# Patient Record
Sex: Female | Born: 1935 | Race: White | Hispanic: No | Smoking: Never smoker
Health system: Southern US, Community
[De-identification: ages and names within clinical notes are randomized; demographics above are authoritative.]

## PROBLEM LIST (undated history)

## (undated) DIAGNOSIS — I10 Essential (primary) hypertension: Secondary | ICD-10-CM

## (undated) DIAGNOSIS — K219 Gastro-esophageal reflux disease without esophagitis: Secondary | ICD-10-CM

## (undated) DIAGNOSIS — C801 Malignant (primary) neoplasm, unspecified: Secondary | ICD-10-CM

## (undated) DIAGNOSIS — T7840XA Allergy, unspecified, initial encounter: Secondary | ICD-10-CM

## (undated) DIAGNOSIS — D649 Anemia, unspecified: Secondary | ICD-10-CM

## (undated) DIAGNOSIS — M1990 Unspecified osteoarthritis, unspecified site: Secondary | ICD-10-CM

## (undated) DIAGNOSIS — F0390 Unspecified dementia without behavioral disturbance: Secondary | ICD-10-CM

## (undated) HISTORY — DX: Unspecified osteoarthritis, unspecified site: M19.90

## (undated) HISTORY — DX: Allergy, unspecified, initial encounter: T78.40XA

## (undated) HISTORY — DX: Gastro-esophageal reflux disease without esophagitis: K21.9

## (undated) HISTORY — PX: NO PAST SURGERIES: SHX2092

## (undated) HISTORY — DX: Essential (primary) hypertension: I10

---

## 2012-05-30 ENCOUNTER — Ambulatory Visit: Payer: Self-pay | Admitting: Neurology

## 2012-05-30 ENCOUNTER — Encounter: Payer: Self-pay | Admitting: Neurology

## 2012-06-07 ENCOUNTER — Encounter: Payer: Self-pay | Admitting: Neurology

## 2012-07-08 ENCOUNTER — Encounter: Payer: Self-pay | Admitting: Neurology

## 2012-07-13 ENCOUNTER — Ambulatory Visit: Payer: Self-pay | Admitting: Family Medicine

## 2012-07-27 ENCOUNTER — Ambulatory Visit: Payer: Self-pay | Admitting: Family Medicine

## 2012-08-07 ENCOUNTER — Encounter: Payer: Self-pay | Admitting: Neurology

## 2013-05-31 ENCOUNTER — Ambulatory Visit: Payer: Self-pay | Admitting: Family Medicine

## 2013-10-03 ENCOUNTER — Ambulatory Visit: Payer: Self-pay | Admitting: Family Medicine

## 2013-10-26 ENCOUNTER — Ambulatory Visit: Payer: Self-pay | Admitting: Gastroenterology

## 2015-02-19 ENCOUNTER — Telehealth: Payer: Self-pay | Admitting: Family Medicine

## 2015-02-19 NOTE — Telephone Encounter (Signed)
Pt called wanting to get a referral for an orthopedic physician for her ankle. Pt wanted to know if she can get the referral without an appointment. Pt agreeable to callback for further appointment details.

## 2015-02-20 NOTE — Telephone Encounter (Signed)
Patient scheduling for next week.[facility]

## 2015-02-28 ENCOUNTER — Ambulatory Visit (INDEPENDENT_AMBULATORY_CARE_PROVIDER_SITE_OTHER): Payer: Medicare Other | Admitting: Family Medicine

## 2015-02-28 ENCOUNTER — Encounter: Payer: Self-pay | Admitting: Family Medicine

## 2015-02-28 VITALS — BP 117/70 | HR 96 | Resp 16 | Ht 62.0 in | Wt 132.0 lb

## 2015-02-28 DIAGNOSIS — Z789 Other specified health status: Secondary | ICD-10-CM | POA: Insufficient documentation

## 2015-02-28 DIAGNOSIS — R32 Unspecified urinary incontinence: Secondary | ICD-10-CM | POA: Insufficient documentation

## 2015-02-28 DIAGNOSIS — J302 Other seasonal allergic rhinitis: Secondary | ICD-10-CM | POA: Insufficient documentation

## 2015-02-28 DIAGNOSIS — I10 Essential (primary) hypertension: Secondary | ICD-10-CM | POA: Insufficient documentation

## 2015-02-28 DIAGNOSIS — E785 Hyperlipidemia, unspecified: Secondary | ICD-10-CM | POA: Insufficient documentation

## 2015-02-28 DIAGNOSIS — E039 Hypothyroidism, unspecified: Secondary | ICD-10-CM | POA: Insufficient documentation

## 2015-02-28 DIAGNOSIS — M810 Age-related osteoporosis without current pathological fracture: Secondary | ICD-10-CM | POA: Insufficient documentation

## 2015-02-28 DIAGNOSIS — N3944 Nocturnal enuresis: Secondary | ICD-10-CM | POA: Insufficient documentation

## 2015-02-28 DIAGNOSIS — M1990 Unspecified osteoarthritis, unspecified site: Secondary | ICD-10-CM | POA: Insufficient documentation

## 2015-02-28 DIAGNOSIS — B019 Varicella without complication: Secondary | ICD-10-CM | POA: Insufficient documentation

## 2015-02-28 DIAGNOSIS — J45909 Unspecified asthma, uncomplicated: Secondary | ICD-10-CM | POA: Insufficient documentation

## 2015-02-28 DIAGNOSIS — M79604 Pain in right leg: Secondary | ICD-10-CM

## 2015-02-28 DIAGNOSIS — J441 Chronic obstructive pulmonary disease with (acute) exacerbation: Secondary | ICD-10-CM | POA: Insufficient documentation

## 2015-02-28 DIAGNOSIS — J45901 Unspecified asthma with (acute) exacerbation: Secondary | ICD-10-CM

## 2015-02-28 DIAGNOSIS — K222 Esophageal obstruction: Secondary | ICD-10-CM | POA: Insufficient documentation

## 2015-02-28 NOTE — Progress Notes (Signed)
Name: Melanie Simpson   MRN: [id number]    DOB: Apr 23, 1924   Date:02/28/2015       Progress Note  Subjective  Chief Complaint  Chief Complaint  Patient presents with  . Leg Pain    Here ONLY for referral to Ortho    HPI  Here to get referral to [facility] (Dr. Sabra Heck) re: her R. Leg pain.  Wears a brace and it helps.  Past Medical History  Diagnosis Date  . COPD (chronic obstructive pulmonary disease)   . GERD (gastroesophageal reflux disease)   . Hypertension   . Allergy   . Arthritis     History  Substance Use Topics  . Smoking status: Never Smoker   . Smokeless tobacco: Never Used  . Alcohol Use: No     Current outpatient prescriptions:  .  ADVAIR DISKUS 250-50 MCG/DOSE AEPB, , Disp: , Rfl: 11 .  albuterol (PROAIR HFA) 108 (90 BASE) MCG/ACT inhaler, Inhale into the lungs., Disp: , Rfl:  .  alendronate (FOSAMAX) 70 MG tablet, , Disp: , Rfl: 5 .  aspirin 81 MG tablet, Take 81 mg by mouth daily., Disp: , Rfl:  .  ipratropium (ATROVENT) 0.06 % nasal spray, , Disp: , Rfl: 5 .  levothyroxine (SYNTHROID, LEVOTHROID) 100 MCG tablet, , Disp: , Rfl: 5 .  lisinopril (PRINIVIL,ZESTRIL) 40 MG tablet, , Disp: , Rfl: 11 .  montelukast (SINGULAIR) 10 MG tablet, , Disp: , Rfl: 1 .  nortriptyline (PAMELOR) 25 MG capsule, , Disp: , Rfl: 11 .  pantoprazole (PROTONIX) 40 MG tablet, , Disp: , Rfl: 11 .  fluticasone (FLONASE) 50 MCG/ACT nasal spray, , Disp: , Rfl: 11 .  furosemide (LASIX) 20 MG tablet, Take by mouth., Disp: , Rfl:   Allergies  Allergen Reactions  . Other Hives    Review of Systems  Constitutional: Negative.  Negative for fever, chills and malaise/fatigue.  HENT: Negative.   Eyes: Negative.  Negative for blurred vision and double vision.  Respiratory: Negative.  Negative for cough, sputum production, shortness of breath and wheezing.   Cardiovascular: Negative.  Negative for chest pain, palpitations, orthopnea and leg swelling.  Gastrointestinal: Negative.   Negative for heartburn, abdominal pain and blood in stool.  Genitourinary: Negative.  Negative for dysuria, urgency, frequency and hematuria.  Musculoskeletal: Positive for joint pain (R. lower leg pain with walking).  Skin: Negative.  Negative for rash.  Neurological: Negative.  Negative for dizziness, sensory change, focal weakness, weakness and headaches.  Psychiatric/Behavioral: Negative.  Negative for depression and substance abuse. The patient is not nervous/anxious.       Objective  Filed Vitals:   02/28/15 1014  BP: 117/70  Pulse: 96  Resp: 16  Height: 5\' 2"  (1.575 m)  Weight: 132 lb (59.875 kg)     Physical Exam  Constitutional: She is oriented to person, place, and time and well-developed, well-nourished, and in no distress. No distress.  HENT:  Head: Normocephalic and atraumatic.  Eyes: EOM are normal. Pupils are equal, round, and reactive to light.  Neck: Normal range of motion. Neck supple. No thyromegaly present.  Cardiovascular: Normal rate, regular rhythm, normal heart sounds and intact distal pulses.  Exam reveals no gallop and no friction rub.   No murmur heard. Pulmonary/Chest: Effort normal and breath sounds normal. No respiratory distress. She has no wheezes. She exhibits no tenderness.  Musculoskeletal: She exhibits edema (trace edema L. lower leg.) and tenderness (r. lower ler with leg brace.).  Lymphadenopathy:  She has no cervical adenopathy.  Neurological: She is alert and oriented to person, place, and time.  Vitals reviewed.        Assessment & Plan  1. Leg pain, right  Refer to Dr. Earnestine Leys re: her leg pain.

## 2015-04-08 ENCOUNTER — Other Ambulatory Visit: Payer: Self-pay | Admitting: Family Medicine

## 2015-04-08 MED ORDER — PANTOPRAZOLE SODIUM 40 MG PO TBEC: 40.0000 mg | DELAYED_RELEASE_TABLET | Freq: Every day | ORAL | Status: DC

## 2015-04-10 ENCOUNTER — Other Ambulatory Visit: Payer: Self-pay | Admitting: Family Medicine

## 2015-04-10 MED ORDER — PANTOPRAZOLE SODIUM 40 MG PO TBEC: 40.0000 mg | DELAYED_RELEASE_TABLET | Freq: Two times a day (BID) | ORAL | Status: DC

## 2015-06-21 ENCOUNTER — Other Ambulatory Visit: Payer: Self-pay | Admitting: Family Medicine

## 2015-06-21 MED ORDER — NORTRIPTYLINE HCL 25 MG PO CAPS: 25.0000 mg | ORAL_CAPSULE | Freq: Every day | ORAL | Status: DC

## 2015-07-30 ENCOUNTER — Ambulatory Visit
Admission: RE | Admit: 2015-07-30 | Discharge: 2015-07-30 | Disposition: A | Discharge status: Home / Self Care | Payer: Medicare Other | Source: Ambulatory Visit | Attending: Family Medicine | Admitting: Family Medicine

## 2015-07-30 ENCOUNTER — Ambulatory Visit (INDEPENDENT_AMBULATORY_CARE_PROVIDER_SITE_OTHER): Payer: Medicare Other | Admitting: Family Medicine

## 2015-07-30 ENCOUNTER — Encounter: Payer: Self-pay | Admitting: Family Medicine

## 2015-07-30 VITALS — BP 139/80 | HR 90 | Temp 97.4°F | Resp 16 | Ht 62.0 in | Wt 133.0 lb

## 2015-07-30 DIAGNOSIS — S6992XA Unspecified injury of left wrist, hand and finger(s), initial encounter: Secondary | ICD-10-CM

## 2015-07-30 DIAGNOSIS — W19XXXA Unspecified fall, initial encounter: Secondary | ICD-10-CM | POA: Insufficient documentation

## 2015-07-30 NOTE — Progress Notes (Signed)
Name: Melanie Simpson   MRN: [id number]    DOB: [birth year]   Date:07/30/2015       Progress Note  Subjective  Chief Complaint  Chief Complaint  Patient presents with  . Hand Pain    left    HPI Fell 3 weeks ago on cement porch.  Doesn't remember hitting L hand, but L hand sore since.  L lateral dorsal hand swollen and sore to palp. Over 5th metacarpal.  No problem-specific assessment & plan notes found for this encounter.   Past Medical History  Diagnosis Date  . COPD (chronic obstructive pulmonary disease) ([place])   . GERD (gastroesophageal reflux disease)   . Hypertension   . Allergy   . Arthritis     Social History  Substance Use Topics  . Smoking status: Never Smoker   . Smokeless tobacco: Never Used  . Alcohol Use: No     Current outpatient prescriptions:  .  ADVAIR DISKUS 250-50 MCG/DOSE AEPB, , Disp: , Rfl: 11 .  albuterol (PROAIR HFA) 108 (90 BASE) MCG/ACT inhaler, Inhale into the lungs., Disp: , Rfl:  .  alendronate (FOSAMAX) 70 MG tablet, , Disp: , Rfl: 5 .  aspirin 81 MG tablet, Take 81 mg by mouth daily., Disp: , Rfl:  .  fluticasone (FLONASE) 50 MCG/ACT nasal spray, , Disp: , Rfl: 11 .  furosemide (LASIX) 20 MG tablet, Take by mouth., Disp: , Rfl:  .  ipratropium (ATROVENT) 0.06 % nasal spray, , Disp: , Rfl: 5 .  levothyroxine (SYNTHROID, LEVOTHROID) 100 MCG tablet, , Disp: , Rfl: 5 .  lisinopril (PRINIVIL,ZESTRIL) 40 MG tablet, , Disp: , Rfl: 11 .  montelukast (SINGULAIR) 10 MG tablet, , Disp: , Rfl: 1 .  nortriptyline (PAMELOR) 25 MG capsule, Take 1 capsule (25 mg total) by mouth at bedtime., Disp: 90 capsule, Rfl: 1 .  pantoprazole (PROTONIX) 40 MG tablet, Take 1 tablet (40 mg total) by mouth 2 (two) times daily., Disp: 180 tablet, Rfl: 3  Allergies  Allergen Reactions  . Other Hives    Review of Systems  Constitutional: Negative for fever, chills, weight loss and malaise/fatigue.  Eyes: Negative for blurred vision and double vision.   Respiratory: Negative for shortness of breath and wheezing.   Cardiovascular: Positive for leg swelling (usual). Negative for chest pain and palpitations.  Gastrointestinal: Negative for heartburn, abdominal pain and blood in stool.  Genitourinary: Negative for dysuria, urgency and frequency.  Musculoskeletal: Positive for joint pain (L lateral hand).  Neurological: Negative for weakness.      Objective  Filed Vitals:   07/30/15 1330  BP: 139/80  Pulse: 90  Temp: 97.4 F (36.3 C)  TempSrc: Oral  Resp: 16  Height: 5\' 2"  (1.575 m)  Weight: 133 lb (60.328 kg)     Physical Exam  Constitutional: She is oriented to person, place, and time and well-developed, well-nourished, and in no distress. No distress.  HENT:  Head: Normocephalic and atraumatic.  Musculoskeletal:  L lateral hand over 5th metacarpal is sl swollen and tender.  Some minimal pain with motion.  Neurological: She is alert and oriented to person, place, and time.  Vitals reviewed.     No results found for this or any previous visit (from the past 2160 hour(s)).   Assessment & Plan  1. Hand injury, left, initial encounter  - DG Hand Complete Left; Future - no gross bony abnormality seen. -Take Tylenol for discomfort.

## 2015-10-03 ENCOUNTER — Telehealth: Payer: Self-pay | Admitting: Family Medicine

## 2015-10-03 ENCOUNTER — Other Ambulatory Visit: Payer: Self-pay | Admitting: Family Medicine

## 2015-10-03 MED ORDER — LISINOPRIL 40 MG PO TABS: 40.0000 mg | ORAL_TABLET | Freq: Every day | ORAL | Status: DC

## 2015-10-03 NOTE — Telephone Encounter (Signed)
Pt called requesting refill on 40 mg lisinopril  [facility] [place]

## 2015-10-03 NOTE — Telephone Encounter (Signed)
I have reordered.  Agree, she needs appt.-jh

## 2015-10-03 NOTE — Telephone Encounter (Signed)
Spoke with pt she has some refill for few days from pharmacy and I advised that she needed appointment to be seen since her last B/P follow up was 11/2014 pt state she will call to make appointment and in Epic it doesn't look like we have refill her lisinopril Thanks Nisha.

## 2015-10-17 ENCOUNTER — Ambulatory Visit (INDEPENDENT_AMBULATORY_CARE_PROVIDER_SITE_OTHER): Payer: Medicare Other | Admitting: Family Medicine

## 2015-10-17 ENCOUNTER — Encounter: Payer: Self-pay | Admitting: Family Medicine

## 2015-10-17 VITALS — BP 135/60 | HR 90 | Temp 97.9°F | Resp 16 | Ht 63.0 in | Wt 133.0 lb

## 2015-10-17 DIAGNOSIS — K2100 Gastro-esophageal reflux disease with esophagitis, without bleeding: Secondary | ICD-10-CM

## 2015-10-17 DIAGNOSIS — M810 Age-related osteoporosis without current pathological fracture: Secondary | ICD-10-CM

## 2015-10-17 DIAGNOSIS — J45901 Unspecified asthma with (acute) exacerbation: Secondary | ICD-10-CM | POA: Diagnosis not present

## 2015-10-17 DIAGNOSIS — E034 Atrophy of thyroid (acquired): Secondary | ICD-10-CM

## 2015-10-17 DIAGNOSIS — J441 Chronic obstructive pulmonary disease with (acute) exacerbation: Secondary | ICD-10-CM

## 2015-10-17 DIAGNOSIS — I63539 Cerebral infarction due to unspecified occlusion or stenosis of unspecified posterior cerebral artery: Secondary | ICD-10-CM

## 2015-10-17 DIAGNOSIS — I10 Essential (primary) hypertension: Secondary | ICD-10-CM | POA: Diagnosis not present

## 2015-10-17 DIAGNOSIS — E038 Other specified hypothyroidism: Secondary | ICD-10-CM

## 2015-10-17 DIAGNOSIS — M79604 Pain in right leg: Secondary | ICD-10-CM

## 2015-10-17 DIAGNOSIS — G20 Parkinson's disease: Secondary | ICD-10-CM | POA: Diagnosis not present

## 2015-10-17 MED ORDER — NORTRIPTYLINE HCL 10 MG PO CAPS: 10.0000 mg | ORAL_CAPSULE | Freq: Every day | ORAL | Status: DC

## 2015-10-17 NOTE — Progress Notes (Signed)
Name: Melanie Simpson   MRN: [id number]    DOB: 09-Jun-1924   Date:10/17/2015       Progress Note  Subjective  Chief Complaint  Chief Complaint  Patient presents with  . Medication check    HPI Here for f/u of HBP, GERD, allergies and chronic ankle pain.  Also with osteoporosis.  She wishes to significantly decrease her medication because of he age.   She has no c/o today. No problem-specific assessment & plan notes found for this encounter.   Past Medical History  Diagnosis Date  . COPD (chronic obstructive pulmonary disease) ([place])   . GERD (gastroesophageal reflux disease)   . Hypertension   . Allergy   . Arthritis     History reviewed. No pertinent past surgical history.  Family History  Problem Relation Age of Onset  . Heart disease Father   . Diabetes Sister   . Diabetes Son   . Cancer Other     breast    Social History   Social History  . Marital Status: Widowed    Spouse Name: N/A  . Number of Children: N/A  . Years of Education: N/A   Occupational History  . Not on file.   Social History Main Topics  . Smoking status: Never Smoker   . Smokeless tobacco: Never Used  . Alcohol Use: No  . Drug Use: No  . Sexual Activity: Not on file   Other Topics Concern  . Not on file   Social History Narrative     Current outpatient prescriptions:  .  levothyroxine (SYNTHROID, LEVOTHROID) 100 MCG tablet, , Disp: , Rfl: 5 .  lisinopril (PRINIVIL,ZESTRIL) 40 MG tablet, Take 1 tablet (40 mg total) by mouth daily., Disp: 30 tablet, Rfl: 11 .  montelukast (SINGULAIR) 10 MG tablet, , Disp: , Rfl: 1 .  nortriptyline (PAMELOR) 10 MG capsule, Take 1 capsule (10 mg total) by mouth at bedtime., Disp: 30 capsule, Rfl: 6 .  pantoprazole (PROTONIX) 40 MG tablet, Take 1 tablet (40 mg total) by mouth 2 (two) times daily., Disp: 180 tablet, Rfl: 3 .  ADVAIR DISKUS 250-50 MCG/DOSE AEPB, , Disp: , Rfl: 11 .  albuterol (PROAIR HFA) 108 (90 BASE) MCG/ACT inhaler, Inhale into the  lungs., Disp: , Rfl:  .  aspirin 81 MG tablet, Take 81 mg by mouth daily., Disp: , Rfl:  .  fluticasone (FLONASE) 50 MCG/ACT nasal spray, , Disp: , Rfl: 11 .  ipratropium (ATROVENT) 0.06 % nasal spray, , Disp: , Rfl: 5  Allergies  Allergen Reactions  . Other Hives     Review of Systems  Constitutional: Negative for fever, chills, weight loss and malaise/fatigue.  HENT: Negative for hearing loss.   Eyes: Negative for blurred vision and double vision.  Respiratory: Negative for cough, shortness of breath and wheezing.   Cardiovascular: Negative for chest pain, palpitations and leg swelling.  Gastrointestinal: Negative for heartburn, abdominal pain and blood in stool.  Genitourinary: Negative for dysuria, urgency and frequency.  Musculoskeletal: Negative for joint pain.  Skin: Negative for rash.  Neurological: Negative for dizziness, sensory change, focal weakness, weakness and headaches.  Psychiatric/Behavioral: Negative for depression.      Objective  Filed Vitals:   10/17/15 1038 10/17/15 1139  BP: 110/60 135/60  Pulse: 90   Temp: 97.9 F (36.6 C)   TempSrc: Oral   Resp: 16   Height: 5\' 3"  (1.6 m)   Weight: 133 lb (60.328 kg)     Physical Exam  Constitutional: She is oriented to person, place, and time and well-developed, well-nourished, and in no distress. No distress.  HENT:  Head: Normocephalic and atraumatic.  Eyes: Conjunctivae and EOM are normal. Pupils are equal, round, and reactive to light. No scleral icterus.  Neck: Normal range of motion. Neck supple. Carotid bruit is not present. No thyromegaly present.  Cardiovascular: Normal rate, regular rhythm and normal heart sounds.  Exam reveals no gallop and no friction rub.   No murmur heard. Pulmonary/Chest: Effort normal and breath sounds normal. No respiratory distress. She has no wheezes. She has no rales.  Musculoskeletal: She exhibits edema (trace edema in L ankle.).  Brace on R ankle  Lymphadenopathy:     She has no cervical adenopathy.  Neurological: She is alert and oriented to person, place, and time.  Vitals reviewed.      No results found for this or any previous visit (from the past 2160 hour(s)).   Assessment & Plan  Problem List Items Addressed This Visit      Cardiovascular and Mediastinum   Essential hypertension, malignant - Primary   Cerebral vascular accident [facility])     Respiratory   Chronic obstructive asthma with exacerbation ([place])     Digestive   Esophageal reflux     Endocrine   Hypothyroid     Nervous and Auditory   Parkinson disease ([place])     Musculoskeletal and Integument   OP (osteoporosis)     Other   Leg pain, right   Relevant Medications   nortriptyline (PAMELOR) 10 MG capsule      Meds ordered this encounter  Medications  . nortriptyline (PAMELOR) 10 MG capsule    Sig: Take 1 capsule (10 mg total) by mouth at bedtime.    Dispense:  30 capsule    Refill:  6   1. Essential hypertension, malignant Cont. Lisinopril  2. Cerebrovascular accident (CVA) due to occlusion of posterior cerebral artery, unspecified blood vessel laterality ([place])   3. Chronic obstructive asthma with exacerbation ([facility])  Use inhalers prn 4. Gastroesophageal reflux disease with esophagitis Cont. Pantoprazole  5. Hypothyroidism due to acquired atrophy of thyroid  Cont. Levothyroxine 6. OP (osteoporosis)  discontinue Alendronate 7. Parkinson disease ([place])   8. Leg pain, right  - nortriptyline (PAMELOR) 10 MG capsule; Take 1 capsule (10 mg total) by mouth at bedtime.  Dispense: 30 capsule; Refill: 6- dose decreased from 25 to 10 mg/d

## 2015-12-05 ENCOUNTER — Other Ambulatory Visit: Payer: Self-pay | Admitting: Family Medicine

## 2015-12-05 MED ORDER — MONTELUKAST SODIUM 10 MG PO TABS: 10.0000 mg | ORAL_TABLET | Freq: Every day | ORAL | Status: DC

## 2015-12-12 ENCOUNTER — Other Ambulatory Visit: Payer: Self-pay | Admitting: *Deleted

## 2015-12-12 ENCOUNTER — Telehealth: Payer: Self-pay

## 2015-12-12 MED ORDER — MONTELUKAST SODIUM 10 MG PO TABS: 10.0000 mg | ORAL_TABLET | Freq: Every day | ORAL | Status: DC

## 2015-12-12 NOTE — Telephone Encounter (Signed)
Did you want the Singulaor decreased like a few other meds? She states she came in Feb and asked to decrease meds and u agreed but we have sent Rx for 10 mg 1 tab daily and they sent paper refill request for 5mg  and it was still sent 10mg .

## 2015-12-12 NOTE — Telephone Encounter (Signed)
Ok to decrease Singulair to 5 mg/d-jh

## 2015-12-13 MED ORDER — MONTELUKAST SODIUM 5 MG PO CHEW: 5.0000 mg | CHEWABLE_TABLET | Freq: Every day | ORAL | Status: DC

## 2015-12-13 NOTE — Addendum Note (Signed)
Addended by: Devona Konig on: 12/13/2015 08:30 AM   Modules accepted: Orders, Medications

## 2016-01-17 ENCOUNTER — Telehealth: Payer: Self-pay | Admitting: Family Medicine

## 2016-01-17 NOTE — Telephone Encounter (Signed)
Pt needs a referral to Dr. Sabra Heck for leg pain.  Her call back number is [phone]

## 2016-01-17 NOTE — Telephone Encounter (Signed)
Patient was referred June 2016 to [facility] Dr. Sabra Heck. Contact information left on ans.machine to contact them to schedule ov.

## 2016-04-30 ENCOUNTER — Other Ambulatory Visit: Payer: Self-pay | Admitting: Family Medicine

## 2016-05-07 ENCOUNTER — Ambulatory Visit: Payer: Medicare Other | Admitting: Family Medicine

## 2016-05-28 ENCOUNTER — Encounter: Payer: Self-pay | Admitting: Family Medicine

## 2016-05-28 ENCOUNTER — Ambulatory Visit (INDEPENDENT_AMBULATORY_CARE_PROVIDER_SITE_OTHER): Payer: Medicare Other | Admitting: Family Medicine

## 2016-05-28 VITALS — BP 121/72 | HR 87 | Temp 97.7°F | Resp 16 | Ht 62.0 in | Wt 141.0 lb

## 2016-05-28 DIAGNOSIS — M1990 Unspecified osteoarthritis, unspecified site: Secondary | ICD-10-CM

## 2016-05-28 DIAGNOSIS — J45901 Unspecified asthma with (acute) exacerbation: Secondary | ICD-10-CM | POA: Diagnosis not present

## 2016-05-28 DIAGNOSIS — K219 Gastro-esophageal reflux disease without esophagitis: Secondary | ICD-10-CM | POA: Diagnosis not present

## 2016-05-28 DIAGNOSIS — I639 Cerebral infarction, unspecified: Secondary | ICD-10-CM

## 2016-05-28 DIAGNOSIS — E034 Atrophy of thyroid (acquired): Secondary | ICD-10-CM

## 2016-05-28 DIAGNOSIS — J441 Chronic obstructive pulmonary disease with (acute) exacerbation: Secondary | ICD-10-CM

## 2016-05-28 DIAGNOSIS — E038 Other specified hypothyroidism: Secondary | ICD-10-CM

## 2016-05-28 DIAGNOSIS — I10 Essential (primary) hypertension: Secondary | ICD-10-CM | POA: Diagnosis not present

## 2016-05-28 LAB — TSH: TSH: 8.2 mIU/L — ABNORMAL HIGH

## 2016-05-28 MED ORDER — ALBUTEROL SULFATE HFA 108 (90 BASE) MCG/ACT IN AERS: 1.0000 | INHALATION_SPRAY | Freq: Four times a day (QID) | RESPIRATORY_TRACT | 12 refills | Status: DC | PRN

## 2016-05-28 NOTE — Progress Notes (Signed)
Name: Melanie Simpson   MRN: [id number]    DOB: 1923/09/09   Date:05/28/2016       Progress Note  Subjective  Chief Complaint  Chief Complaint  Patient presents with  . Hypertension    HPI Here for f/u of Hypothyroidism.  She has not been taking any thyroid med for an unknown period of time.  She is taking BP med and allergy meds.  Uses Pamelor for sleep and pain. She is having to use her Albuterol occ for the first time in some timed and needs refill.  Got flu shot at pharmacy (grand daughter is pharmacist) No problem-specific Assessment & Plan notes found for this encounter.   Past Medical History:  Diagnosis Date  . Allergy   . Arthritis   . COPD (chronic obstructive pulmonary disease) ([facility])   . GERD (gastroesophageal reflux disease)   . Hypertension     History reviewed. No pertinent surgical history.  Family History  Problem Relation Age of Onset  . Heart disease Father   . Diabetes Sister   . Diabetes Son   . Cancer Other     breast    Social History   Social History  . Marital status: Widowed    Spouse name: N/A  . Number of children: N/A  . Years of education: N/A   Occupational History  . Not on file.   Social History Main Topics  . Smoking status: Never Smoker  . Smokeless tobacco: Never Used  . Alcohol use No  . Drug use: No  . Sexual activity: Not on file   Other Topics Concern  . Not on file   Social History Narrative  . No narrative on file     Current Outpatient Prescriptions:  .  albuterol (PROAIR HFA) 108 (90 Base) MCG/ACT inhaler, Inhale 1-2 puffs into the lungs every 6 (six) hours as needed for wheezing or shortness of breath., Disp: 1 Inhaler, Rfl: 12 .  levothyroxine (SYNTHROID, LEVOTHROID) 100 MCG tablet, , Disp: , Rfl: 5 .  lisinopril (PRINIVIL,ZESTRIL) 40 MG tablet, Take 1 tablet (40 mg total) by mouth daily., Disp: 30 tablet, Rfl: 11 .  montelukast (SINGULAIR) 10 MG tablet, Take 10 mg by mouth daily., Disp: , Rfl: 2 .   nortriptyline (PAMELOR) 10 MG capsule, Take 1 capsule (10 mg total) by mouth at bedtime., Disp: 30 capsule, Rfl: 6 .  pantoprazole (PROTONIX) 40 MG tablet, take 1 tablet by mouth once daily, Disp: 30 tablet, Rfl: 6  Allergies  Allergen Reactions  . Other Hives     Review of Systems  Constitutional: Negative for chills, fever, malaise/fatigue and weight loss.  HENT: Negative for hearing loss.   Eyes: Negative for blurred vision and double vision.  Respiratory: Negative for cough, shortness of breath and wheezing.   Cardiovascular: Negative for chest pain, palpitations and leg swelling.  Gastrointestinal: Negative for abdominal pain, blood in stool and heartburn.  Genitourinary: Negative for dysuria, frequency and urgency.  Musculoskeletal: Positive for joint pain (R leg pain, chronic.). Negative for myalgias.  Skin: Negative for rash.  Neurological: Negative for dizziness, tremors, weakness and headaches.      Objective  Vitals:   05/28/16 1512  BP: 121/72  Pulse: 87  Resp: 16  Temp: 97.7 F (36.5 C)  TempSrc: Oral  Weight: 141 lb (64 kg)  Height: 5\' 2"  (1.575 m)    Physical Exam  Constitutional: She is oriented to person, place, and time and well-developed, well-nourished, and in no distress. No  distress.  HENT:  Head: Normocephalic and atraumatic.  Eyes: Conjunctivae and EOM are normal. Pupils are equal, round, and reactive to light. No scleral icterus.  Neck: Normal range of motion. Neck supple. Carotid bruit is not present. No thyromegaly present.  Cardiovascular: Normal rate, regular rhythm and normal heart sounds.  Exam reveals no gallop and no friction rub.   No murmur heard. Pulmonary/Chest: Effort normal and breath sounds normal. No respiratory distress. She has no wheezes. She has no rales.  Abdominal: Soft. Bowel sounds are normal. She exhibits no distension and no mass. There is no tenderness.  Musculoskeletal: She exhibits no edema.  Lymphadenopathy:     She has no cervical adenopathy.  Neurological: She is alert and oriented to person, place, and time.  Vitals reviewed.      No results found for this or any previous visit (from the past 2160 hour(s)).   Assessment & Plan  Problem List Items Addressed This Visit      Cardiovascular and Mediastinum   Essential hypertension, malignant   Essential (primary) hypertension - Primary   Cerebral vascular accident ([place])     Respiratory   Chronic obstructive asthma with exacerbation ([facility])   Relevant Medications   montelukast (SINGULAIR) 10 MG tablet   albuterol (PROAIR HFA) 108 (90 Base) MCG/ACT inhaler     Digestive   Gastric reflux     Endocrine   Hypothyroid   Relevant Orders   TSH     Musculoskeletal and Integument   Arthritis    Other Visit Diagnoses   None.     Meds ordered this encounter  Medications  . montelukast (SINGULAIR) 10 MG tablet    Sig: Take 10 mg by mouth daily.    Refill:  2  . albuterol (PROAIR HFA) 108 (90 Base) MCG/ACT inhaler    Sig: Inhale 1-2 puffs into the lungs every 6 (six) hours as needed for wheezing or shortness of breath.    Dispense:  1 Inhaler    Refill:  12   1. Essential (primary) hypertension Cont  Lisinopril  2. Essential hypertension, malignant   3. Cerebrovascular accident (CVA), unspecified mechanism ([place])   4. Gastric reflux Cont Protonix  5. Arthritis   6. Hypothyroidism due to acquired atrophy of thyroid  - TSH  7. Chronic obstructive asthma with exacerbation ([facility])  - albuterol (PROAIR HFA) 108 (90 Base) MCG/ACT inhaler; Inhale 1-2 puffs into the lungs every 6 (six) hours as needed for wheezing or shortness of breath.  Dispense: 1 Inhaler; Refill: 12

## 2016-06-01 ENCOUNTER — Other Ambulatory Visit: Payer: Self-pay | Admitting: *Deleted

## 2016-06-01 ENCOUNTER — Other Ambulatory Visit: Payer: Self-pay | Admitting: Family Medicine

## 2016-06-01 MED ORDER — LEVOTHYROXINE SODIUM 125 MCG PO TABS: 125.0000 ug | ORAL_TABLET | Freq: Every day | ORAL | 6 refills | Status: DC

## 2016-08-06 ENCOUNTER — Other Ambulatory Visit: Payer: Self-pay | Admitting: Family Medicine

## 2016-08-06 DIAGNOSIS — M79604 Pain in right leg: Secondary | ICD-10-CM

## 2016-10-24 IMAGING — CR DG HAND COMPLETE 3+V*L*
1 series · 3 of 3 positions shown · non-contrast
Comparison: None.

CLINICAL DATA: Fell backwards 3 weeks ago, now with left hand pain

EXAM:
LEFT HAND - COMPLETE 3+ VIEW

[Series 1: pa · 0.17mm/px · 3 of 3 slices shown]
[im 1/3]
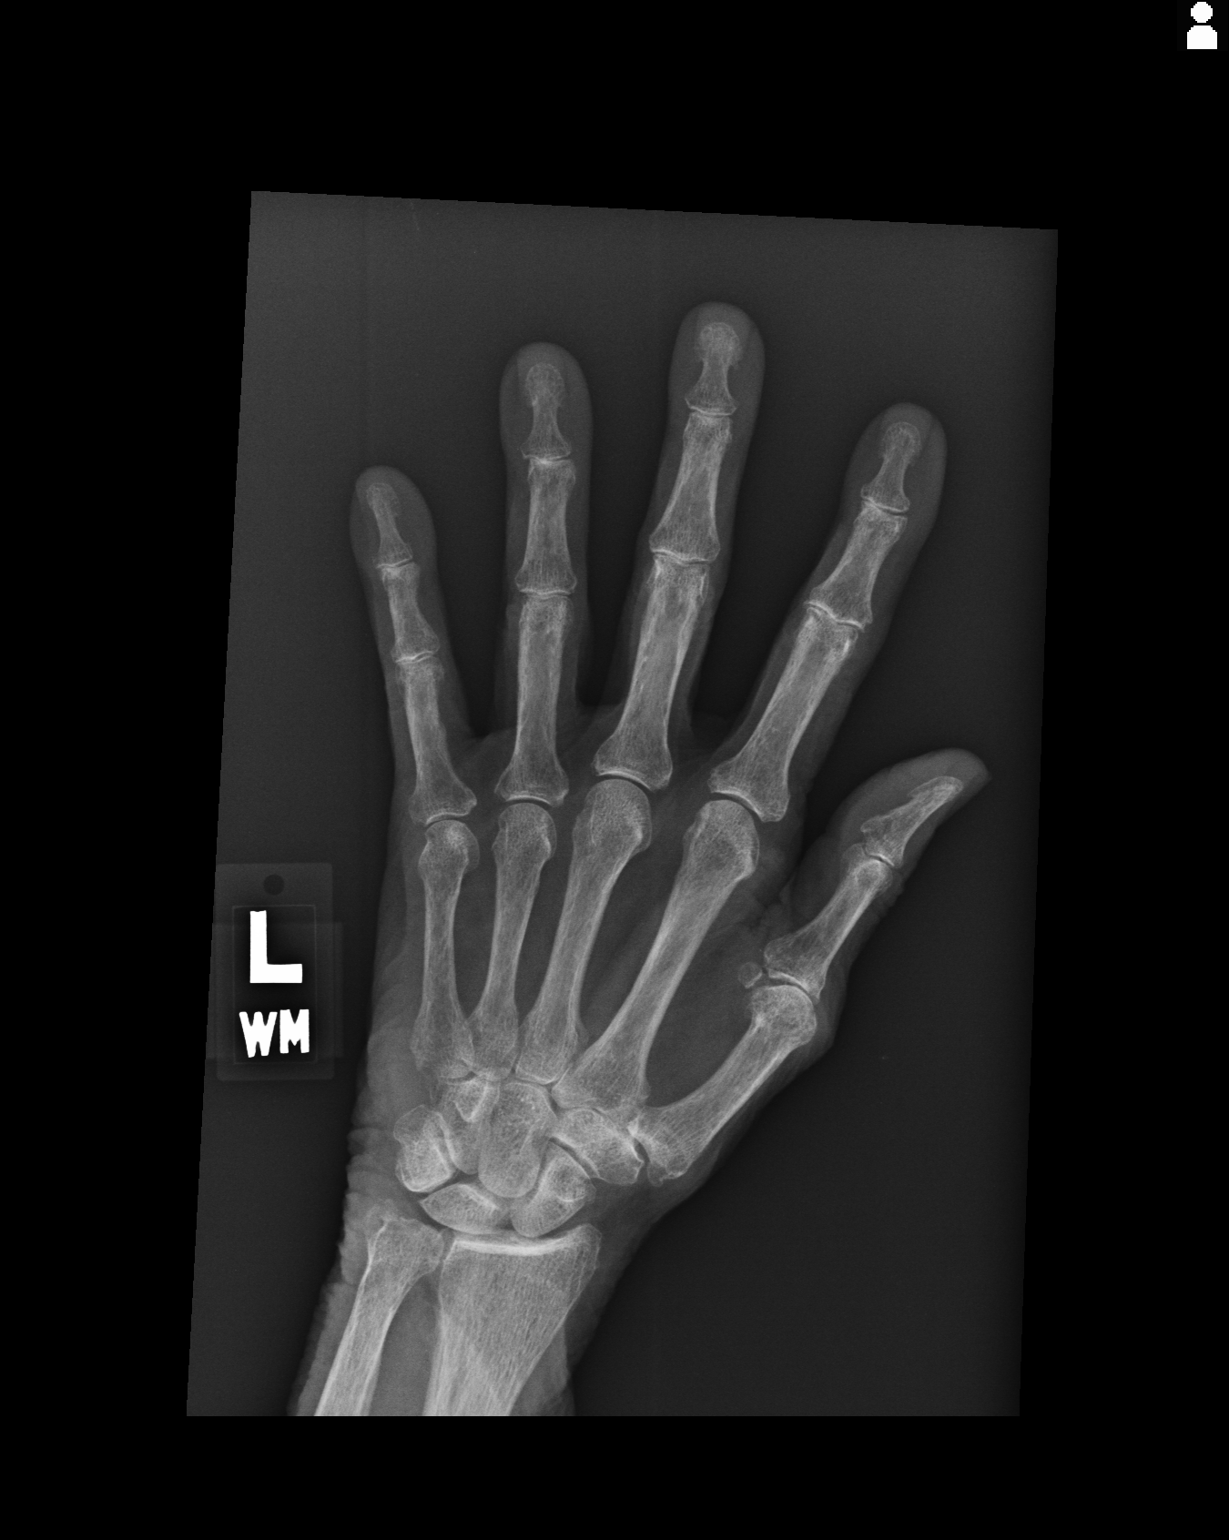
[im 2/3]
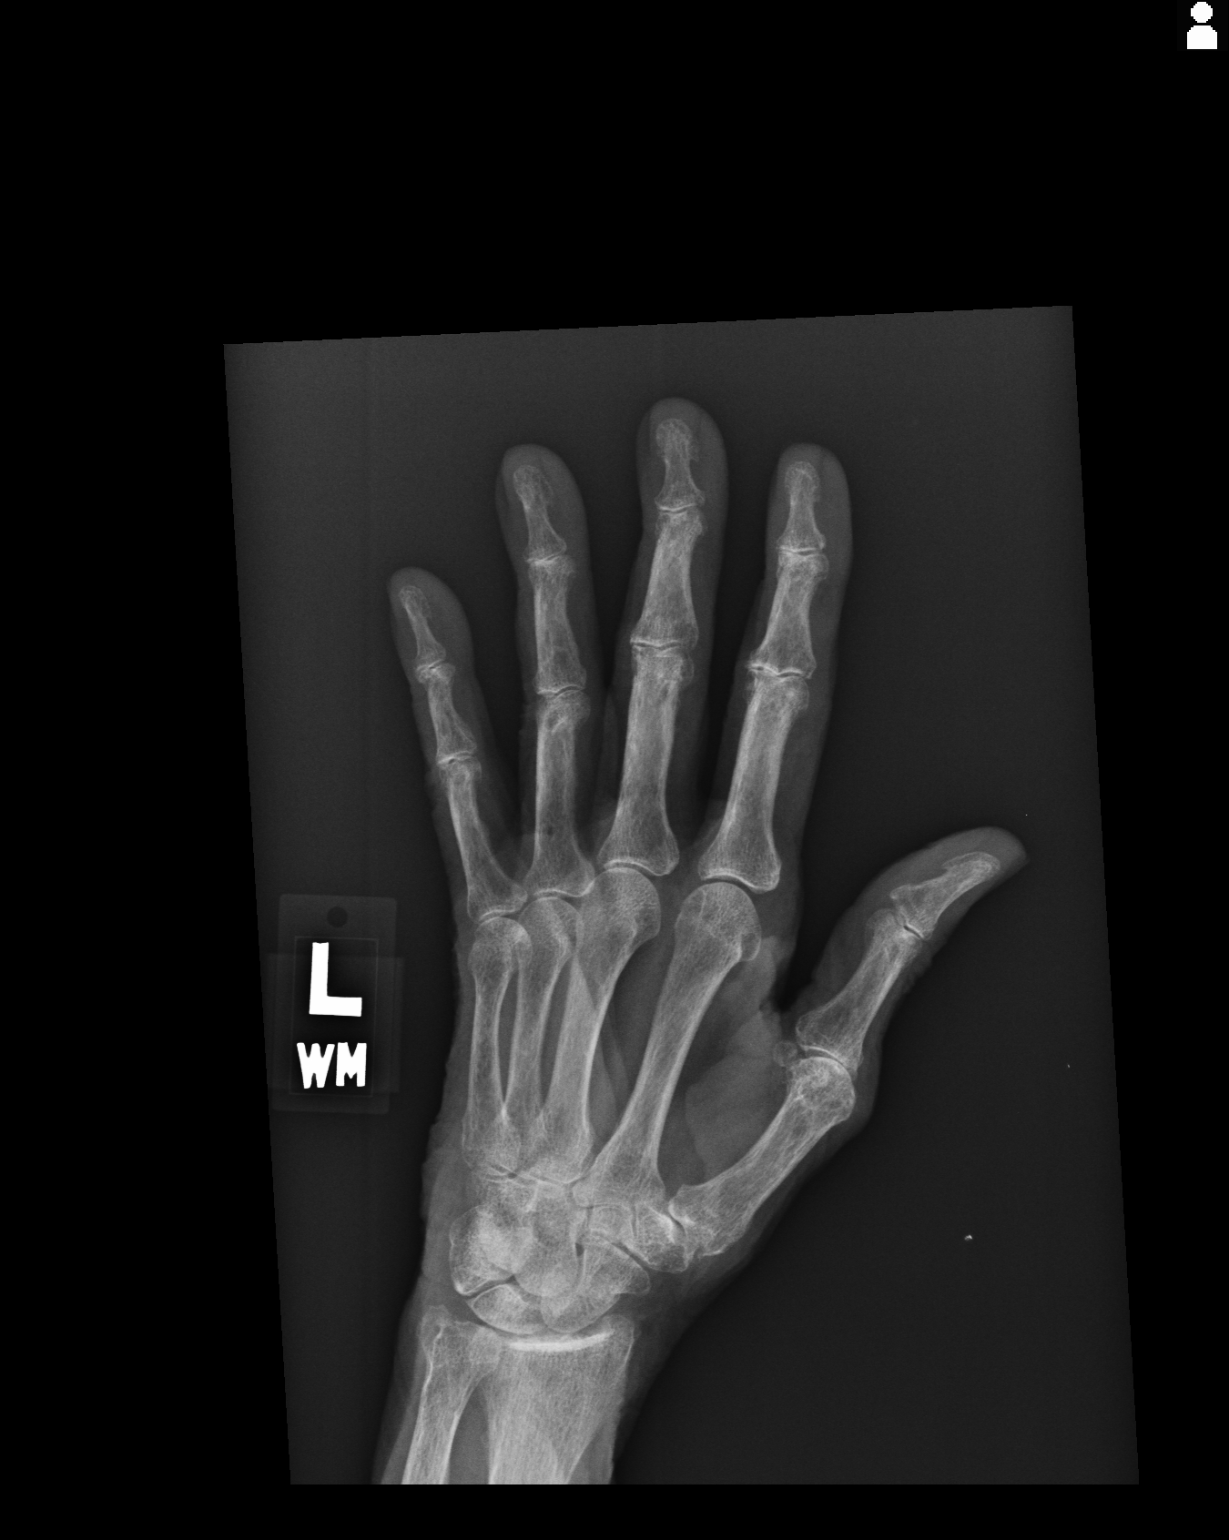
[im 3/3]
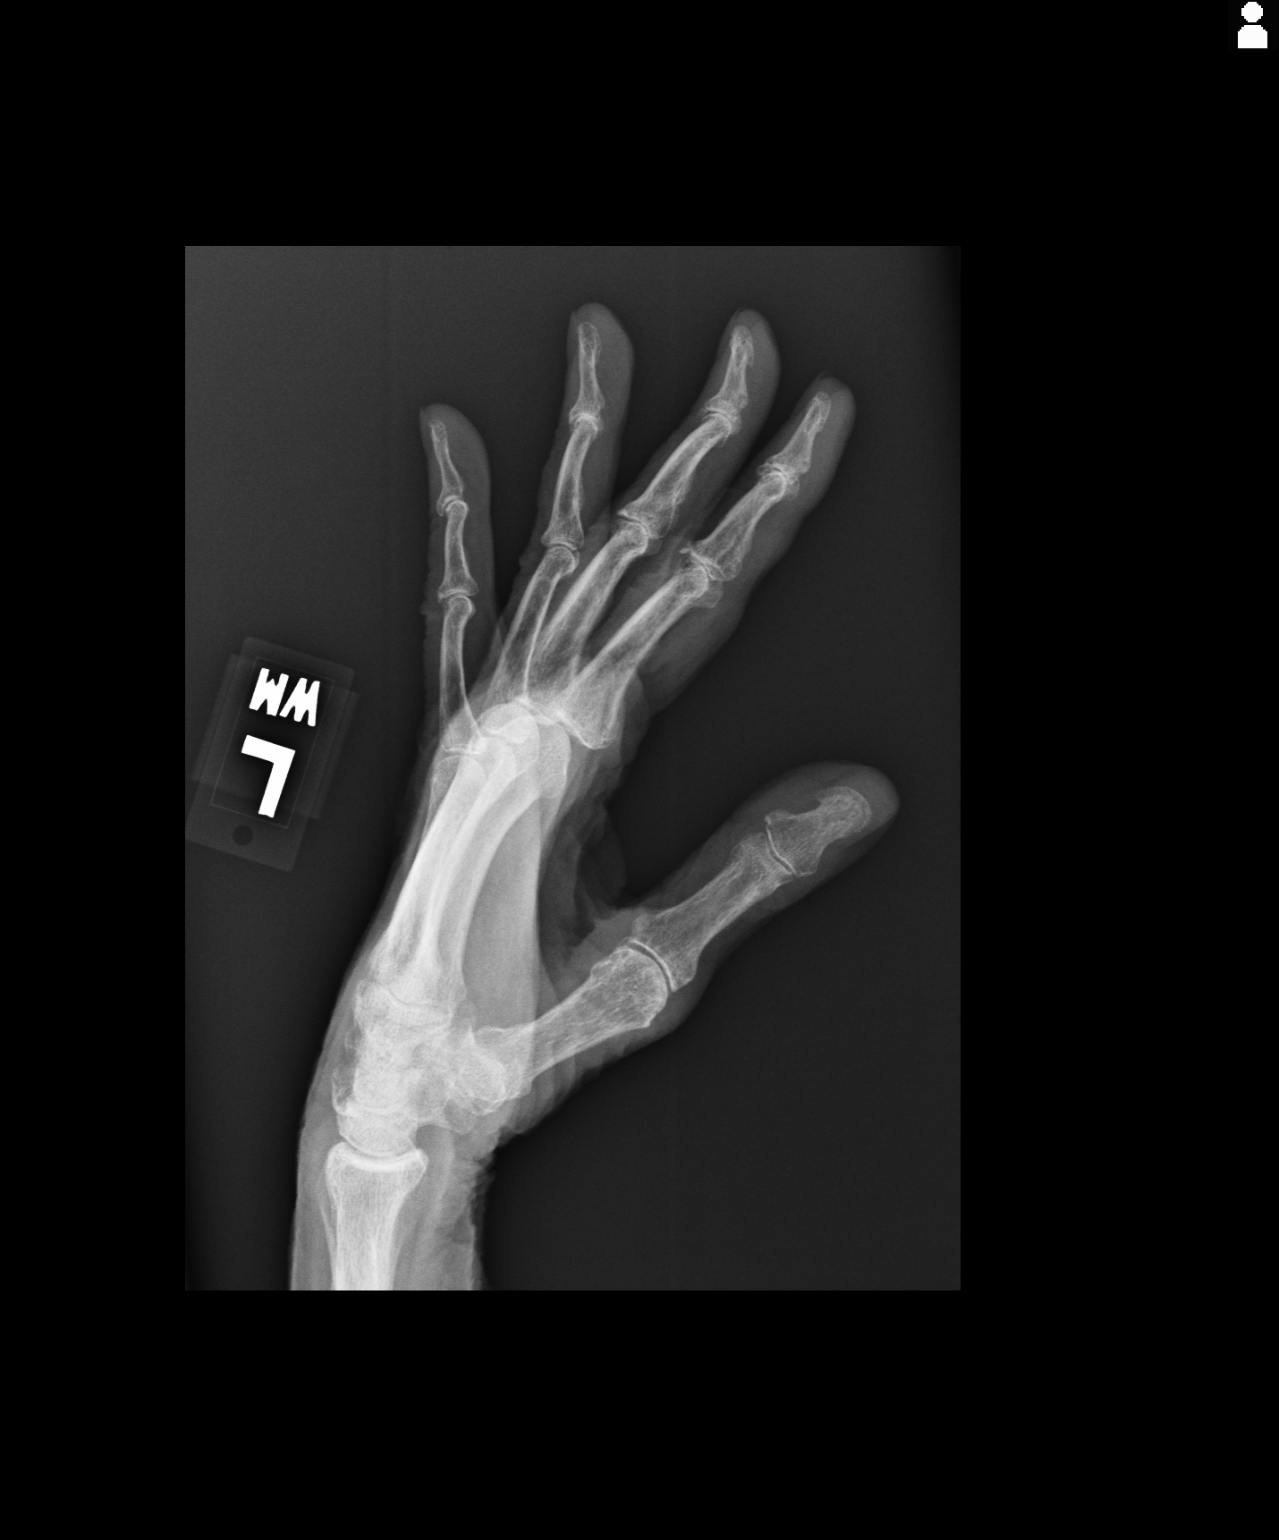

[3 of 3 positions shown; findings below may reference images not displayed]

FINDINGS: The radiocarpal joint space is unremarkable. The carpal bones are
normal position. There is degenerative change involving the left
first CMC joint as well as degenerative change throughout the DIP
and to a lesser degree PIP joints diffusely. No definite fracture is
seen. No erosion is noted.
IMPRESSION: Changes of osteoarthritis.  No acute abnormality.

## 2016-11-26 ENCOUNTER — Ambulatory Visit: Payer: Medicare Other | Admitting: Family Medicine

## 2017-03-11 ENCOUNTER — Ambulatory Visit (INDEPENDENT_AMBULATORY_CARE_PROVIDER_SITE_OTHER): Payer: Medicare Other | Admitting: Family Medicine

## 2017-03-11 ENCOUNTER — Other Ambulatory Visit: Payer: Self-pay | Admitting: Family Medicine

## 2017-03-11 ENCOUNTER — Encounter: Payer: Self-pay | Admitting: Family Medicine

## 2017-03-11 VITALS — BP 132/66 | HR 73 | Temp 97.8°F | Resp 16 | Ht 62.0 in | Wt 140.0 lb

## 2017-03-11 DIAGNOSIS — E034 Atrophy of thyroid (acquired): Secondary | ICD-10-CM | POA: Diagnosis not present

## 2017-03-11 DIAGNOSIS — J3089 Other allergic rhinitis: Secondary | ICD-10-CM

## 2017-03-11 DIAGNOSIS — I10 Essential (primary) hypertension: Secondary | ICD-10-CM

## 2017-03-11 DIAGNOSIS — E782 Mixed hyperlipidemia: Secondary | ICD-10-CM

## 2017-03-11 DIAGNOSIS — K219 Gastro-esophageal reflux disease without esophagitis: Secondary | ICD-10-CM

## 2017-03-11 DIAGNOSIS — M810 Age-related osteoporosis without current pathological fracture: Secondary | ICD-10-CM | POA: Diagnosis not present

## 2017-03-11 DIAGNOSIS — J4520 Mild intermittent asthma, uncomplicated: Secondary | ICD-10-CM

## 2017-03-11 DIAGNOSIS — R7309 Other abnormal glucose: Secondary | ICD-10-CM

## 2017-03-11 MED ORDER — LEVOTHYROXINE SODIUM 125 MCG PO TABS: 125.0000 ug | ORAL_TABLET | Freq: Every day | ORAL | 5 refills | Status: DC

## 2017-03-11 MED ORDER — MONTELUKAST SODIUM 10 MG PO TABS: 10.0000 mg | ORAL_TABLET | Freq: Every day | ORAL | 11 refills | Status: DC

## 2017-03-11 NOTE — Patient Instructions (Addendum)
Thank you for coming to the clinic today.  1. STOP taking Nortriptyline (bedtime medicine for sleep and nerve pain)  STOP taking Pantoprazole (Protonix) 40mg  daily - this was for stomach acid.  If you feel like you still need something every once in a while for stomach acid or acid reflux, then go ahead and try Over the counter OTC Omeprazole or Prilosec 20mg  - take one pill 30 min before first meal of day in morning every day for 2 to 4 weeks at a time OR ONLY AS NEEDED  2. You will be due for FASTING BLOOD WORK (no food or drink after midnight before, only water or coffee without cream/sugar on the morning of)  - Please go ahead and schedule a "Lab Only" visit in the morning at the clinic for lab draw in 3 months  before next Annual Physical - Make sure Lab Only appointment is at least 1-2 weeks before your next appointment, so that results will be available  For Lab Results, once available within 2-3 days of blood draw, you can can log in to MyChart online to view your results and a brief explanation. Also, we can discuss results at next follow-up visit.  Please schedule a Follow-up Appointment to: Return in about 3 months (around 06/11/2017) for Medicare Physical CPE.  If you have any other questions or concerns, please feel free to call the clinic or send a message through [place]. You may also schedule an earlier appointment if necessary.  Additionally, you may be receiving a survey about your experience at our clinic within a few days to 1 week by e-mail or mail. We value your feedback.  Nobie Putnam, DO [place]

## 2017-03-11 NOTE — Progress Notes (Signed)
Subjective:    Patient ID: Melanie Simpson, female    DOB: Jan 16, 1924, 83 y.o.   MRN: [id number]  Melanie Simpson is a 83 y.o. female presenting on 03/11/2017 for Hypothyroidism   HPI   CHRONIC HTN: Reports unsure when initial dx HTN, but states she has not had major problem with BP, not checking anymore at home. Current Meds - None   Lifestyle - Limited exercise, tries to eat healthy, drinks water Denies CP, dyspnea, HA, edema, dizziness / lightheadedness  Hypothyroidism Chronic problem, uncertain duration, has been on same dose medicine for long time, Levothyroxine 166mcg daily, last TSH 05/2016, was abnormal. Clinically has no new symptoms or concerns. - Denies temperature changes, hair changes, weight changes  GERD - Unsure exact diagnosis, in past was taking heartburn medicine, had seen GI Dr Wohl 2015, prior diagnosis on chart with esophageal stricture but no further information, declines that she had EGD done before. Currently taking intermittent protonix 40mg . - Denies heartburn, abdominal pain, nausea, vomiting, dysphagia  History of Osteoporosis - Has never had any fracture - Interested in [place] in future - Prior on Fosamax DC'd by prior PCP  Additionally taking Singulair for Allergies/Asthma and Nortriptyline for sleep.  Social History  Substance Use Topics  . Smoking status: Never Smoker  . Smokeless tobacco: Never Used  . Alcohol use 0.0 oz/week    Review of Systems Per HPI unless specifically indicated above     Objective:    BP 132/66   Pulse 73   Temp 97.8 F (36.6 C) (Oral)   Resp 16   Ht 5\' 2"  (1.575 m)   Wt 140 lb (63.5 kg)   BMI 25.61 kg/m   Wt Readings from Last 3 Encounters:  03/11/17 140 lb (63.5 kg)  05/28/16 141 lb (64 kg)  10/17/15 133 lb (60.3 kg)    Physical Exam  Constitutional: She is oriented to person, place, and time. She appears well-developed and well-nourished. No distress.  Well-appearing, comfortable, cooperative 83  year old female  HENT:  Head: Normocephalic and atraumatic.  Mouth/Throat: Oropharynx is clear and moist.  Eyes: Conjunctivae are normal. Right eye exhibits no discharge. Left eye exhibits no discharge.  Neck: Normal range of motion. Neck supple. No thyromegaly present.  Cardiovascular: Normal rate, regular rhythm, normal heart sounds and intact distal pulses.   No murmur heard. Pulmonary/Chest: Effort normal and breath sounds normal. No respiratory distress. She has no wheezes. She has no rales.  Musculoskeletal: She exhibits edema (mild non pitting edema bilateral lower extremity, has compression stockings).  Ambulate with 4 prong cane  Lymphadenopathy:    She has no cervical adenopathy.  Neurological: She is alert and oriented to person, place, and time.  Skin: Skin is warm and dry. No rash noted. She is not diaphoretic. No erythema.  Psychiatric: She has a normal mood and affect. Her behavior is normal.  Well groomed, good eye contact, normal speech and thoughts  Nursing note and vitals reviewed.  Results for orders placed or performed in visit on 05/28/16  TSH  Result Value Ref Range   TSH 8.20 (H) mIU/L      Assessment & Plan:   Problem List Items Addressed This Visit    Senile osteoporosis    Diagnosis on chart review, without available DEXA and patient unable to recall. No known fractures. Prior bisphosphonate therapy dc'd by prior PCP >1 year ago - Will order future DEXA at next visit      Hypothyroid - Primary  Clinically stable on current dose since last changed Last TSH >8 (05/2016) Continue current Levothyroxine 134mcg refilled to [facility] Check future labs 3 months TSH, Free T4      Relevant Medications   levothyroxine (SYNTHROID, LEVOTHROID) 125 MCG tablet   GERD (gastroesophageal reflux disease)    Stable chronic GERD with possible history of stricture. Unavailable detailed records and patient cannot recall. Without any current concerns.  Plan: 1.  Discontinue intermittent Pantoprazole 40mg  - switch to OTC Omeprazole 20mg  - just 2 week intervals or less PRN if needed, counseled on risks and complications of long-term PPI      Essential hypertension    Well-controlled HTN with lifestyle, off meds - Home BP readings normal (rarely checks now)  No known complications - no recent Cr available   Plan:  1. Continue without anti-HTN therapy 2. Encourage improved lifestyle - low sodium diet, regular exercise as tolerated 3. Continue monitor BP outside office, bring readings to next visit, if persistently >140/90 or new symptoms notify office sooner 4. Follow-up q 3 months for now       Allergic rhinitis, seasonal    Stable, controlled on Singulair      Relevant Medications   montelukast (SINGULAIR) 10 MG tablet   Allergic asthma    Stable, controlled on Singulair Not using Albuterol      Relevant Medications   montelukast (SINGULAIR) 10 MG tablet      Meds ordered this encounter  Medications  . montelukast (SINGULAIR) 10 MG tablet    Sig: Take 1 tablet (10 mg total) by mouth daily.    Dispense:  30 tablet    Refill:  11  . levothyroxine (SYNTHROID, LEVOTHROID) 125 MCG tablet    Sig: Take 1 tablet (125 mcg total) by mouth daily.    Dispense:  30 tablet    Refill:  5      Follow up plan: Return in about 3 months (around 06/11/2017) for Medicare Physical CPE.  Nobie Putnam, [facility] 03/11/2017, 3:56 PM

## 2017-03-11 NOTE — Assessment & Plan Note (Signed)
Stable, controlled on Singulair Not using Albuterol

## 2017-03-11 NOTE — Assessment & Plan Note (Signed)
Stable chronic GERD with possible history of stricture. Unavailable detailed records and patient cannot recall. Without any current concerns.  Plan: 1. Discontinue intermittent Pantoprazole 40mg  - switch to OTC Omeprazole 20mg  - just 2 week intervals or less PRN if needed, counseled on risks and complications of long-term PPI

## 2017-03-11 NOTE — Assessment & Plan Note (Signed)
Stable, controlled on Singulair

## 2017-03-11 NOTE — Assessment & Plan Note (Signed)
Clinically stable on current dose since last changed Last TSH >8 (05/2016) Continue current Levothyroxine 166mcg refilled to [facility] Check future labs 3 months TSH, Free T4

## 2017-03-11 NOTE — Assessment & Plan Note (Signed)
Diagnosis on chart review, without available DEXA and patient unable to recall. No known fractures. Prior bisphosphonate therapy dc'd by prior PCP >1 year ago - Will order future DEXA at next visit

## 2017-03-11 NOTE — Assessment & Plan Note (Signed)
Well-controlled HTN with lifestyle, off meds - Home BP readings normal (rarely checks now)  No known complications - no recent Cr available   Plan:  1. Continue without anti-HTN therapy 2. Encourage improved lifestyle - low sodium diet, regular exercise as tolerated 3. Continue monitor BP outside office, bring readings to next visit, if persistently >140/90 or new symptoms notify office sooner 4. Follow-up q 3 months for now

## 2017-06-17 ENCOUNTER — Other Ambulatory Visit: Payer: Medicare Other

## 2017-06-17 DIAGNOSIS — I10 Essential (primary) hypertension: Secondary | ICD-10-CM

## 2017-06-17 DIAGNOSIS — R7309 Other abnormal glucose: Secondary | ICD-10-CM

## 2017-06-17 DIAGNOSIS — E034 Atrophy of thyroid (acquired): Secondary | ICD-10-CM

## 2017-06-17 DIAGNOSIS — E782 Mixed hyperlipidemia: Secondary | ICD-10-CM

## 2017-06-18 LAB — CBC WITH DIFFERENTIAL/PLATELET
BASOS ABS: 22 {cells}/uL (ref 0–200)
Basophils Relative: 0.5 %
Eosinophils Absolute: 101 cells/uL (ref 15–500)
Eosinophils Relative: 2.3 %
HCT: 37.6 % (ref 35.0–45.0)
Hemoglobin: 13 g/dL (ref 11.7–15.5)
Lymphs Abs: 1597 cells/uL (ref 850–3900)
MCH: 31.2 pg (ref 27.0–33.0)
MCHC: 34.6 g/dL (ref 32.0–36.0)
MCV: 90.2 fL (ref 80.0–100.0)
MONOS PCT: 7.3 %
MPV: 10.7 fL (ref 7.5–12.5)
NEUTROS ABS: 2358 {cells}/uL (ref 1500–7800)
Neutrophils Relative %: 53.6 %
PLATELETS: 123 10*3/uL — AB (ref 140–400)
RBC: 4.17 10*6/uL (ref 3.80–5.10)
RDW: 12.7 % (ref 11.0–15.0)
TOTAL LYMPHOCYTE: 36.3 %
WBC mixed population: 321 cells/uL (ref 200–950)
WBC: 4.4 10*3/uL (ref 3.8–10.8)

## 2017-06-18 LAB — LIPID PANEL
Cholesterol: 284 mg/dL — ABNORMAL HIGH (ref <200)
HDL: 58 mg/dL (ref >50)
LDL CHOLESTEROL (CALC): 193 mg/dL — AB
Non-HDL Cholesterol (Calc): 226 mg/dL (calc) — ABNORMAL HIGH (ref <130)
Total CHOL/HDL Ratio: 4.9 (calc) (ref <5.0)
Triglycerides: 161 mg/dL — ABNORMAL HIGH (ref <150)

## 2017-06-18 LAB — COMPLETE METABOLIC PANEL WITH GFR
AG RATIO: 1.4 (calc) (ref 1.0–2.5)
ALT: 8 U/L (ref 6–29)
AST: 16 U/L (ref 10–35)
Albumin: 3.6 g/dL (ref 3.6–5.1)
Alkaline phosphatase (APISO): 41 U/L (ref 33–130)
BILIRUBIN TOTAL: 0.7 mg/dL (ref 0.2–1.2)
BUN: 10 mg/dL (ref 7–25)
CHLORIDE: 104 mmol/L (ref 98–110)
CO2: 29 mmol/L (ref 20–32)
Calcium: 9 mg/dL (ref 8.6–10.4)
Creat: 0.77 mg/dL (ref 0.60–0.88)
GFR, Est African American: 78 mL/min/{1.73_m2} (ref >=60)
GFR, Est Non African American: 68 mL/min/{1.73_m2} (ref >=60)
GLOBULIN: 2.6 g/dL (ref 1.9–3.7)
Glucose, Bld: 93 mg/dL (ref 65–99)
POTASSIUM: 3.8 mmol/L (ref 3.5–5.3)
SODIUM: 141 mmol/L (ref 135–146)
Total Protein: 6.2 g/dL (ref 6.1–8.1)

## 2017-06-18 LAB — TSH: TSH: 5.71 m[IU]/L — AB (ref 0.40–4.50)

## 2017-06-18 LAB — HEMOGLOBIN A1C
HEMOGLOBIN A1C: 5.3 %{Hb} (ref <5.7)
MEAN PLASMA GLUCOSE: 105 (calc)
eAG (mmol/L): 5.8 (calc)

## 2017-06-18 LAB — T4, FREE: FREE T4: 1 ng/dL (ref 0.8–1.8)

## 2017-06-24 ENCOUNTER — Encounter: Payer: Self-pay | Admitting: Family Medicine

## 2017-06-24 ENCOUNTER — Other Ambulatory Visit: Payer: Self-pay | Admitting: Family Medicine

## 2017-06-24 ENCOUNTER — Encounter: Payer: Medicare Other | Admitting: Family Medicine

## 2017-06-24 ENCOUNTER — Ambulatory Visit (INDEPENDENT_AMBULATORY_CARE_PROVIDER_SITE_OTHER): Payer: Medicare Other | Admitting: Family Medicine

## 2017-06-24 VITALS — BP 137/60 | HR 78 | Temp 98.1°F | Resp 16 | Ht 62.0 in | Wt 140.0 lb

## 2017-06-24 DIAGNOSIS — E782 Mixed hyperlipidemia: Secondary | ICD-10-CM

## 2017-06-24 DIAGNOSIS — E034 Atrophy of thyroid (acquired): Secondary | ICD-10-CM | POA: Diagnosis not present

## 2017-06-24 DIAGNOSIS — M810 Age-related osteoporosis without current pathological fracture: Secondary | ICD-10-CM

## 2017-06-24 DIAGNOSIS — I10 Essential (primary) hypertension: Secondary | ICD-10-CM

## 2017-06-24 DIAGNOSIS — D696 Thrombocytopenia, unspecified: Secondary | ICD-10-CM

## 2017-06-24 MED ORDER — LEVOTHYROXINE SODIUM 125 MCG PO TABS: 125.0000 ug | ORAL_TABLET | Freq: Every day | ORAL | 11 refills | Status: DC

## 2017-06-24 MED ORDER — ROSUVASTATIN CALCIUM 5 MG PO TABS: ORAL_TABLET | ORAL | 0 refills | Status: DC

## 2017-06-24 NOTE — Assessment & Plan Note (Signed)
Uncontrolled cholesterol with poor lifestyle Last lipid panel 06/2017 Elevated ASCVD risk  Plan: 1. Discussion of ASCVD risk reduction - patient never on statin before, overall remains in reasonably good health, and interested in reducing risk with low dose statin. Counseled on benefit/risk potential side effect and titration dosing - Start Rosuvastatin 5mg  weekly at night - if tolerated gradual increase to TIW 2. Encourage improved lifestyle - low carb/cholesterol, reduce portion size, continue walking exercise as tolerated 4. Follow-up 6 months - repeat labs with lipid

## 2017-06-24 NOTE — Patient Instructions (Addendum)
Thank you for coming to the clinic today.  1. We discussed elevated cholesterol results Go ahead and start new cholesterol medicine - Rosuvastatin generic 5mg  ONCE WEEKLY AT BEDTIME, if tolerate well without muscles or joint pains can increase dose to THREE times weekly.  For DEXA Scan (Bone mineral density) test for osteoporosis  Call the [facility] below anytime to schedule your own appointment now that order has been placed.  [facility] [place], [place] [address] Phone: [phone]  DUE for FASTING BLOOD WORK (no food or drink after midnight before the lab appointment, only water or coffee without cream/sugar on the morning of)  SCHEDULE "Lab Only" visit in the morning at the clinic for lab draw in 6 MONTHS  - Make sure Lab Only appointment is at about 1 week before your next appointment, so that results will be available  For Lab Results, once available within 2-3 days of blood draw, you can can log in to MyChart online to view your results and a brief explanation. Also, we can discuss results at next follow-up visit.   Please schedule a Follow-up Appointment to: Return in about 6 months (around 12/23/2017) for HTN, HLD, Osteoporosis.  If you have any other questions or concerns, please feel free to call the clinic or send a message through [place]. You may also schedule an earlier appointment if necessary.  Additionally, you may be receiving a survey about your experience at our clinic within a few days to 1 week by e-mail or mail. We value your feedback.  Nobie Putnam, DO [facility]

## 2017-06-24 NOTE — Progress Notes (Signed)
Subjective:    Patient ID: Melanie Simpson, female    DOB: 21-Dec-1923, 83 y.o.   MRN: [id number]  Melanie Simpson is a 83 y.o. female presenting on 06/24/2017 for Annual Exam; Hypertension; and Hyperlipidemia   HPI   Here for Annual Physical and Lab Results.  CHRONIC HTN: Reports no new concerns. Not checking BP at home Current Meds - None   Lifestyle - Limited exercise, tries to eat healthy, drinking increased amt water  Hypothyroidism - Since last visit has remained on same levothyroxine dose 12mcg daily, prior TSH 8.2 in 2017, now recent labs show TSH improved to 5.71, but normal Free T4 - Clinically has no new symptoms or concerns.  HYPERLIPIDEMIA: - Reports no concerns. Last lipid panel 06/2017, with abnormal elevated LDL - Not on statin or cholesterol medicine   GERD - Unsure exact diagnosis, in past was taking heartburn medicine, had seen GI Dr Wohl 2015, prior diagnosis on chart with esophageal stricture but no further information, declines that she had EGD done before. Currently taking intermittent protonix 40mg .  History of Osteoporosis - due for DEXA. History of fosamax previously, stopped 1 year ago by prior PCP  Health Maintenance: - UTD Flu Shot (reported at outside pharmacy updated), Pneumonia vaccines - Due for DEXA  Depression screen [facility] 2/9 06/24/2017 05/28/2016 10/17/2015  Decreased Interest 0 0 0  Down, Depressed, Hopeless 0 0 0  PHQ - 2 Score 0 0 0    Past Medical History:  Diagnosis Date  . Allergy   . Arthritis   . GERD (gastroesophageal reflux disease)   . Hypertension    No past surgical history on file. Social History   Social History  . Marital status: Widowed    Spouse name: N/A  . Number of children: N/A  . Years of education: N/A   Occupational History  . Not on file.   Social History Main Topics  . Smoking status: Never Smoker  . Smokeless tobacco: Never Used  . Alcohol use 0.0 oz/week  . Drug use: No  . Sexual  activity: Not on file   Other Topics Concern  . Not on file   Social History Narrative  . No narrative on file   Family History  Problem Relation Age of Onset  . Heart disease Father   . Diabetes Sister   . Diabetes Son   . Cancer Other        breast   Current Outpatient Prescriptions on File Prior to Visit  Medication Sig  . albuterol (PROAIR HFA) 108 (90 Base) MCG/ACT inhaler Inhale 1-2 puffs into the lungs every 6 (six) hours as needed for wheezing or shortness of breath. (Patient not taking: Reported on 03/11/2017)   No current facility-administered medications on file prior to visit.     Review of Systems  Constitutional: Negative for activity change, appetite change, chills, diaphoresis, fatigue, fever and unexpected weight change.  HENT: Negative for congestion and hearing loss.   Eyes: Negative for visual disturbance.  Respiratory: Negative for apnea, cough, chest tightness, shortness of breath and wheezing.   Cardiovascular: Negative for chest pain, palpitations and leg swelling.  Gastrointestinal: Negative for abdominal pain, constipation, diarrhea, nausea and vomiting.  Endocrine: Negative for cold intolerance and polyuria.  Genitourinary: Negative for difficulty urinating, dysuria, frequency and hematuria.  Musculoskeletal: Negative for arthralgias and neck pain.  Skin: Negative for rash.  Allergic/Immunologic: Negative for environmental allergies.  Neurological: Negative for dizziness, weakness, light-headedness, numbness and headaches.  Hematological: Negative for  adenopathy.  Psychiatric/Behavioral: Negative for behavioral problems, dysphoric mood and sleep disturbance. The patient is not nervous/anxious.    Per HPI unless specifically indicated above     Objective:    BP 137/60   Pulse 78   Temp 98.1 F (36.7 C) (Other (Comment))   Resp 16   Ht 5\' 2"  (1.575 m)   Wt 140 lb (63.5 kg)   BMI 25.61 kg/m   Wt Readings from Last 3 Encounters:  06/24/17 140  lb (63.5 kg)  03/11/17 140 lb (63.5 kg)  05/28/16 141 lb (64 kg)    Physical Exam  Constitutional: She is oriented to person, place, and time. She appears well-developed and well-nourished. No distress.  Well-appearing, comfortable, cooperative 83 year old female  HENT:  Head: Normocephalic and atraumatic.  Mouth/Throat: Oropharynx is clear and moist.  Oropharynx clear without erythema, exudates, edema or asymmetry.  Eyes: Conjunctivae are normal. Right eye exhibits no discharge. Left eye exhibits no discharge.  Neck: Normal range of motion. Neck supple. No thyromegaly present.  Cardiovascular: Normal rate, regular rhythm, normal heart sounds and intact distal pulses.   No murmur heard. Pulmonary/Chest: Effort normal and breath sounds normal. No respiratory distress. She has no wheezes. She has no rales.  Abdominal: Soft. Bowel sounds are normal. She exhibits no distension. There is no tenderness.  Musculoskeletal: She exhibits edema (mild non pitting edema bilateral lower extremity, has compression stockings).  Ambulate with 4 prong cane  Lymphadenopathy:    She has no cervical adenopathy.  Neurological: She is alert and oriented to person, place, and time.  Skin: Skin is warm and dry. No rash noted. She is not diaphoretic. No erythema.  Psychiatric: She has a normal mood and affect. Her behavior is normal.  Well groomed, good eye contact, normal speech and thoughts  Nursing note and vitals reviewed.  Results for orders placed or performed in visit on 06/17/17  COMPLETE METABOLIC PANEL WITH GFR  Result Value Ref Range   Glucose, Bld 93 65 - 99 mg/dL   BUN 10 7 - 25 mg/dL   Creat 0.77 0.60 - 0.88 mg/dL   GFR, Est Non African American 68 > OR = 60 mL/min/1.38m2   GFR, Est African American 78 > OR = 60 mL/min/1.49m2   BUN/Creatinine Ratio NOT APPLICABLE 6 - 22 (calc)   Sodium 141 135 - 146 mmol/L   Potassium 3.8 3.5 - 5.3 mmol/L   Chloride 104 98 - 110 mmol/L   CO2 29 20 - 32  mmol/L   Calcium 9.0 8.6 - 10.4 mg/dL   Total Protein 6.2 6.1 - 8.1 g/dL   Albumin 3.6 3.6 - 5.1 g/dL   Globulin 2.6 1.9 - 3.7 g/dL (calc)   AG Ratio 1.4 1.0 - 2.5 (calc)   Total Bilirubin 0.7 0.2 - 1.2 mg/dL   Alkaline phosphatase (APISO) 41 33 - 130 U/L   AST 16 10 - 35 U/L   ALT 8 6 - 29 U/L  CBC with Differential/Platelet  Result Value Ref Range   WBC 4.4 3.8 - 10.8 Thousand/uL   RBC 4.17 3.80 - 5.10 Million/uL   Hemoglobin 13.0 11.7 - 15.5 g/dL   HCT 37.6 35.0 - 45.0 %   MCV 90.2 80.0 - 100.0 fL   MCH 31.2 27.0 - 33.0 pg   MCHC 34.6 32.0 - 36.0 g/dL   RDW 12.7 11.0 - 15.0 %   Platelets 123 (L) 140 - 400 Thousand/uL   MPV 10.7 7.5 - 12.5 fL  Neutro Abs 2,358 1,500 - 7,800 cells/uL   Lymphs Abs 1,597 850 - 3,900 cells/uL   WBC mixed population 321 200 - 950 cells/uL   Eosinophils Absolute 101 15 - 500 cells/uL   Basophils Absolute 22 0 - 200 cells/uL   Neutrophils Relative % 53.6 %   Total Lymphocyte 36.3 %   Monocytes Relative 7.3 %   Eosinophils Relative 2.3 %   Basophils Relative 0.5 %   Smear Review    Lipid panel  Result Value Ref Range   Cholesterol 284 (H) <200 mg/dL   HDL 58 >50 mg/dL   Triglycerides 161 (H) <150 mg/dL   LDL Cholesterol (Calc) 193 (H) mg/dL (calc)   Total CHOL/HDL Ratio 4.9 <5.0 (calc)   Non-HDL Cholesterol (Calc) 226 (H) <130 mg/dL (calc)  Hemoglobin A1c  Result Value Ref Range   Hgb A1c MFr Bld 5.3 <5.7 % of total Hgb   Mean Plasma Glucose 105 (calc)   eAG (mmol/L) 5.8 (calc)  TSH  Result Value Ref Range   TSH 5.71 (H) 0.40 - 4.50 mIU/L  T4, free  Result Value Ref Range   Free T4 1.0 0.8 - 1.8 ng/dL      Assessment & Plan:   Problem List Items Addressed This Visit    Essential hypertension    Well-controlled HTN with lifestyle, off meds No home readings No known complications   Plan:  1. Continue without anti-HTN therapy 2. Encourage improved lifestyle - low sodium diet, regular exercise as tolerated 3. Continue monitor  BP outside office, bring readings to next visit, if persistently >140/90 or new symptoms notify office sooner 4. Follow-up q 6 mo      Relevant Medications   rosuvastatin (CRESTOR) 5 MG tablet   Hyperlipemia    Uncontrolled cholesterol with poor lifestyle Last lipid panel 06/2017 Elevated ASCVD risk  Plan: 1. Discussion of ASCVD risk reduction - patient never on statin before, overall remains in reasonably good health, and interested in reducing risk with low dose statin. Counseled on benefit/risk potential side effect and titration dosing - Start Rosuvastatin 5mg  weekly at night - if tolerated gradual increase to TIW 2. Encourage improved lifestyle - low carb/cholesterol, reduce portion size, continue walking exercise as tolerated 4. Follow-up 6 months - repeat labs with lipid      Relevant Medications   rosuvastatin (CRESTOR) 5 MG tablet   Hypothyroid - Primary    Clinically stable on current dose since last changed Last TSH 5.7 (from 8.2) with normal Free T4 Continue current Levothyroxine 159mcg Follow-up 6 months consider repeat      Relevant Medications   levothyroxine (SYNTHROID, LEVOTHROID) 125 MCG tablet   Senile osteoporosis    Stable without fracture or complication Prior dx, due for repeat DEXA Off bisphosphonate therapy, was on previously unsure duration, DC'd 1 year ago Order future DEXA, patient to schedule      Relevant Orders   DG Bone Density      Meds ordered this encounter  Medications  . rosuvastatin (CRESTOR) 5 MG tablet    Sig: Take 1 tablet once weekly at bedtime, then if tolerating can increase up to 3 times a week.    Dispense:  30 tablet    Refill:  0  . levothyroxine (SYNTHROID, LEVOTHROID) 125 MCG tablet    Sig: Take 1 tablet (125 mcg total) by mouth daily.    Dispense:  30 tablet    Refill:  11    Follow up plan: Return in about  6 months (around 12/23/2017) for HTN, HLD, Osteoporosis.  Nobie Putnam, DO [facility] 06/24/2017, 10:30 PM

## 2017-06-24 NOTE — Assessment & Plan Note (Signed)
Well-controlled HTN with lifestyle, off meds No home readings No known complications   Plan:  1. Continue without anti-HTN therapy 2. Encourage improved lifestyle - low sodium diet, regular exercise as tolerated 3. Continue monitor BP outside office, bring readings to next visit, if persistently >140/90 or new symptoms notify office sooner 4. Follow-up q 6 mo

## 2017-06-24 NOTE — Assessment & Plan Note (Addendum)
Clinically stable on current dose since last changed Last TSH 5.7 (from 8.2) with normal Free T4 Continue current Levothyroxine 140mcg Follow-up 6 months repeat TSH, Free T4

## 2017-06-24 NOTE — Assessment & Plan Note (Signed)
Stable without fracture or complication Prior dx, due for repeat DEXA Off bisphosphonate therapy, was on previously unsure duration, DC'd 1 year ago Order future DEXA, patient to schedule

## 2017-12-16 ENCOUNTER — Other Ambulatory Visit: Payer: Medicare Other

## 2017-12-23 ENCOUNTER — Ambulatory Visit: Payer: Medicare Other | Admitting: Family Medicine

## 2020-02-28 DIAGNOSIS — I872 Venous insufficiency (chronic) (peripheral): Secondary | ICD-10-CM | POA: Insufficient documentation

## 2020-03-28 ENCOUNTER — Other Ambulatory Visit: Payer: Self-pay | Admitting: General Surgery

## 2020-04-02 ENCOUNTER — Encounter
Admission: RE | Admit: 2020-04-02 | Discharge: 2020-04-02 | Disposition: A | Discharge status: Home / Self Care | Payer: Medicare Other | Source: Ambulatory Visit | Attending: General Surgery | Admitting: General Surgery

## 2020-04-02 ENCOUNTER — Other Ambulatory Visit: Payer: Self-pay

## 2020-04-02 DIAGNOSIS — Z20822 Contact with and (suspected) exposure to covid-19: Secondary | ICD-10-CM | POA: Insufficient documentation

## 2020-04-02 DIAGNOSIS — Z01818 Encounter for other preprocedural examination: Secondary | ICD-10-CM | POA: Insufficient documentation

## 2020-04-02 HISTORY — DX: Anemia, unspecified: D64.9

## 2020-04-02 HISTORY — DX: Malignant (primary) neoplasm, unspecified: C80.1

## 2020-04-02 HISTORY — DX: Unspecified dementia, unspecified severity, without behavioral disturbance, psychotic disturbance, mood disturbance, and anxiety: F03.90

## 2020-04-02 NOTE — Patient Instructions (Signed)
Your procedure is scheduled on: 04-05-20 FRIDAY Report to Same Day Surgery 2nd floor medical mall [facility] Entrance-take elevator on left to 2nd floor.  Check in with surgery information desk.) To find out your arrival time please call [phone] between 1PM - 3PM on 04-04-20 THURSDAY  Remember: Instructions that are not followed completely may result in serious medical risk, up to and including death, or upon the discretion of your surgeon and anesthesiologist your surgery may need to be rescheduled.    _x___ 1. Do not eat food after midnight the night before your procedure. NO GUM OR CANDY AFTER MIDNIGHT. You may drink clear liquids up to 2 hours before you are scheduled to arrive at the hospital for your procedure.  Do not drink clear liquids within 2 hours of your scheduled arrival to the hospital.  Clear liquids include  --Water or Apple juice without pulp  --Gatorade  --Black Coffee or Clear Tea (No milk, no creamers, do not add anything to the coffee or Tea-ok to add sugar)    __x__ 2. No Alcohol for 24 hours before or after surgery.   __x__3. No Smoking or e-cigarettes for 24 prior to surgery.  Do not use any chewable tobacco products for at least 6 hour prior to surgery   ____  4. Bring all medications with you on the day of surgery if instructed.    __x__ 5. Notify your doctor if there is any change in your medical condition     (cold, fever, infections).    x___6. On the morning of surgery brush your teeth with toothpaste and water.  You may rinse your mouth with mouth wash if you wish.  Do not swallow any toothpaste or mouthwash.   Do not wear jewelry, make-up, hairpins, clips or nail polish.  Do not wear lotions, powders, or perfumes.  Do not shave 48 hours prior to surgery. Men may shave face and neck.  Do not bring valuables to the hospital.    [facility] is not responsible for any belongings or valuables.               Contacts, dentures or bridgework may not  be worn into surgery.  Leave your suitcase in the car. After surgery it may be brought to your room.  For patients admitted to the hospital, discharge time is determined by your treatment team.  _  Patients discharged the day of surgery will not be allowed to drive home.  You will need someone to drive you home and stay with you the night of your procedure.    Please read over the following fact sheets that you were given:   [facility] Preparing for Surgery  ____ TAKE THE FOLLOWING MEDICATION THE MORNING OF SURGERY WITH A SMALL SIP OF WATER. These include:  1. NONE  2.  3.  4.  5.  6.  ____Fleets enema or Magnesium Citrate as directed.   _x___ Use CHG WIPES as directed on instruction sheet   ____ Use inhalers on the day of surgery and bring to hospital day of surgery  ____ Stop Metformin and Janumet 2 days prior to surgery.    ____ Take 1/2 of usual insulin dose the night before surgery and none on the morning   surgery.   ____ Follow recommendations from Cardiologist, Pulmonologist or PCP regarding stopping Aspirin, Coumadin, Plavix ,Eliquis, Effient, or Pradaxa, and Pletal.  X____Stop Anti-inflammatories such as Advil, Aleve, Ibuprofen, Motrin, Naproxen, Naprosyn, Goodies powders or aspirin  products NOW-OK to take Tylenol    ____ Stop supplements until after surgery.     ____ Bring C-Pap to the hospital.

## 2020-04-03 ENCOUNTER — Other Ambulatory Visit: Payer: Medicare Other

## 2020-04-03 ENCOUNTER — Encounter
Admission: RE | Admit: 2020-04-03 | Discharge: 2020-04-03 | Disposition: A | Discharge status: Home / Self Care | Payer: Medicare Other | Source: Ambulatory Visit | Attending: General Surgery | Admitting: General Surgery

## 2020-04-03 DIAGNOSIS — Z01818 Encounter for other preprocedural examination: Secondary | ICD-10-CM | POA: Diagnosis not present

## 2020-04-03 DIAGNOSIS — Z20822 Contact with and (suspected) exposure to covid-19: Secondary | ICD-10-CM | POA: Insufficient documentation

## 2020-04-03 LAB — SARS CORONAVIRUS 2 (TAT 6-24 HRS): SARS Coronavirus 2: NEGATIVE

## 2020-04-05 ENCOUNTER — Ambulatory Visit: Payer: Medicare Other | Admitting: Certified Registered Nurse Anesthetist

## 2020-04-05 ENCOUNTER — Other Ambulatory Visit: Payer: Self-pay

## 2020-04-05 ENCOUNTER — Ambulatory Visit
Admission: RE | Admit: 2020-04-05 | Discharge: 2020-04-05 | Disposition: A | Discharge status: Home / Self Care | Payer: Medicare Other | Attending: General Surgery | Admitting: General Surgery

## 2020-04-05 ENCOUNTER — Encounter
Admission: RE | Disposition: A | Discharge status: Home / Self Care | Payer: Self-pay | Source: Home / Self Care | Attending: General Surgery

## 2020-04-05 ENCOUNTER — Encounter: Payer: Self-pay | Admitting: General Surgery

## 2020-04-05 DIAGNOSIS — C44722 Squamous cell carcinoma of skin of right lower limb, including hip: Secondary | ICD-10-CM | POA: Insufficient documentation

## 2020-04-05 DIAGNOSIS — Z79899 Other long term (current) drug therapy: Secondary | ICD-10-CM | POA: Diagnosis not present

## 2020-04-05 DIAGNOSIS — F0390 Unspecified dementia without behavioral disturbance: Secondary | ICD-10-CM | POA: Diagnosis not present

## 2020-04-05 HISTORY — PX: MASS EXCISION: SHX2000

## 2020-04-05 SURGERY — EXCISION MASS
Anesthesia: General | Site: Leg Lower | Laterality: Right

## 2020-04-05 MED ORDER — ONDANSETRON HCL 4 MG/2ML IJ SOLN: 4.0000 mg | Freq: Once | INTRAMUSCULAR | Status: DC | PRN

## 2020-04-05 MED ORDER — CHLORHEXIDINE GLUCONATE 0.12 % MT SOLN: 15.0000 mL | Freq: Once | OROMUCOSAL | Status: AC

## 2020-04-05 MED ORDER — FAMOTIDINE 20 MG PO TABS: 20.0000 mg | ORAL_TABLET | Freq: Once | ORAL | Status: AC

## 2020-04-05 MED ORDER — FENTANYL CITRATE (PF) 100 MCG/2ML IJ SOLN
INTRAMUSCULAR | Status: AC
  Filled 2020-04-05: qty 2

## 2020-04-05 MED ORDER — PROPOFOL 500 MG/50ML IV EMUL
INTRAVENOUS | Status: DC | PRN
  Administered 2020-04-05: 50 ug/kg/min via INTRAVENOUS

## 2020-04-05 MED ORDER — LACTATED RINGERS IV SOLN: INTRAVENOUS | Status: DC

## 2020-04-05 MED ORDER — BUPIVACAINE-EPINEPHRINE (PF) 0.5% -1:200000 IJ SOLN
INTRAMUSCULAR | Status: AC
  Filled 2020-04-05: qty 30

## 2020-04-05 MED ORDER — ONDANSETRON HCL 4 MG/2ML IJ SOLN
INTRAMUSCULAR | Status: DC | PRN
  Administered 2020-04-05: 4 mg via INTRAVENOUS

## 2020-04-05 MED ORDER — HYDROCODONE-ACETAMINOPHEN 5-325 MG PO TABS: ORAL_TABLET | ORAL | 0 refills | Status: DC

## 2020-04-05 MED ORDER — ORAL CARE MOUTH RINSE: 15.0000 mL | Freq: Once | OROMUCOSAL | Status: AC

## 2020-04-05 MED ORDER — CHLORHEXIDINE GLUCONATE 0.12 % MT SOLN
OROMUCOSAL | Status: AC
  Administered 2020-04-05: 15 mL via OROMUCOSAL
  Filled 2020-04-05: qty 15

## 2020-04-05 MED ORDER — FENTANYL CITRATE (PF) 100 MCG/2ML IJ SOLN: 25.0000 ug | INTRAMUSCULAR | Status: DC | PRN

## 2020-04-05 MED ORDER — FAMOTIDINE 20 MG PO TABS
ORAL_TABLET | ORAL | Status: AC
  Administered 2020-04-05: 20 mg via ORAL
  Filled 2020-04-05: qty 1

## 2020-04-05 MED ORDER — CEFAZOLIN SODIUM-DEXTROSE 2-4 GM/100ML-% IV SOLN
2.0000 g | INTRAVENOUS | Status: AC
  Administered 2020-04-05: 2 g via INTRAVENOUS

## 2020-04-05 MED ORDER — FENTANYL CITRATE (PF) 100 MCG/2ML IJ SOLN
INTRAMUSCULAR | Status: DC | PRN
  Administered 2020-04-05: 25 ug via INTRAVENOUS

## 2020-04-05 MED ORDER — CEFAZOLIN SODIUM-DEXTROSE 2-4 GM/100ML-% IV SOLN
INTRAVENOUS | Status: AC
  Filled 2020-04-05: qty 100

## 2020-04-05 MED ORDER — LIDOCAINE HCL (CARDIAC) PF 100 MG/5ML IV SOSY
PREFILLED_SYRINGE | INTRAVENOUS | Status: DC | PRN
  Administered 2020-04-05: 40 mg via INTRATRACHEAL

## 2020-04-05 MED ORDER — PROPOFOL 10 MG/ML IV BOLUS
INTRAVENOUS | Status: AC
  Filled 2020-04-05: qty 20

## 2020-04-05 MED ORDER — BUPIVACAINE-EPINEPHRINE (PF) 0.5% -1:200000 IJ SOLN
INTRAMUSCULAR | Status: DC | PRN
  Administered 2020-04-05: 20 mL

## 2020-04-05 SURGICAL SUPPLY — 42 items
APL PRP STRL LF DISP 70% ISPRP (MISCELLANEOUS) ×1
BLADE SURG 15 STRL SS SAFETY (BLADE) ×1 IMPLANT
BNDG ELASTIC 4X5.8 VLCR STR LF (GAUZE/BANDAGES/DRESSINGS) ×1 IMPLANT
BNDG GAUZE 4.5X4.1 6PLY STRL (MISCELLANEOUS) ×1 IMPLANT
BNDG PST UNNA BOOT 4X10 CALAMN (CAST SUPPLIES) ×1
BNDG UNNA PASTE 4X10 CALAMINE (CAST SUPPLIES) ×1 IMPLANT
CANISTER SUCT 1200ML W/VALVE (MISCELLANEOUS) ×2 IMPLANT
CHLORAPREP W/TINT 26 (MISCELLANEOUS) ×2 IMPLANT
COTTON BALL STRL MEDIUM (GAUZE/BANDAGES/DRESSINGS) ×1 IMPLANT
COVER WAND RF STERILE (DRAPES) ×2 IMPLANT
DRAPE 3/4 80X56 (DRAPES) ×2 IMPLANT
DRAPE LAPAROTOMY 100X77 ABD (DRAPES) ×2 IMPLANT
DRSG TEGADERM 4X4.75 (GAUZE/BANDAGES/DRESSINGS) ×1 IMPLANT
DRSG TELFA 4X3 1S NADH ST (GAUZE/BANDAGES/DRESSINGS) ×2 IMPLANT
ELECT REM PT RETURN 9FT ADLT (ELECTROSURGICAL) ×2
ELECTRODE REM PT RTRN 9FT ADLT (ELECTROSURGICAL) ×1 IMPLANT
GAUZE SPONGE 4X4 12PLY STRL (GAUZE/BANDAGES/DRESSINGS) ×1 IMPLANT
GLOVE BIO SURGEON STRL SZ7.5 (GLOVE) ×2 IMPLANT
GLOVE INDICATOR 8.0 STRL GRN (GLOVE) ×2 IMPLANT
GOWN STRL REUS W/ TWL LRG LVL3 (GOWN DISPOSABLE) ×1 IMPLANT
GOWN STRL REUS W/ TWL XL LVL3 (GOWN DISPOSABLE) ×1 IMPLANT
GOWN STRL REUS W/TWL LRG LVL3 (GOWN DISPOSABLE) ×2
GOWN STRL REUS W/TWL XL LVL3 (GOWN DISPOSABLE) ×2
KIT TURNOVER KIT A (KITS) ×2 IMPLANT
LABEL OR SOLS (LABEL) ×2 IMPLANT
MATRIX WOUND 3-LAYER 5X5 (Tissue) ×1 IMPLANT
NS IRRIG 500ML POUR BTL (IV SOLUTION) ×2 IMPLANT
PACK BASIN MINOR (MISCELLANEOUS) ×2 IMPLANT
PAD PREP 24X41 OB/GYN DISP (PERSONAL CARE ITEMS) ×2 IMPLANT
STOCKINETTE STRL 6IN 960660 (GAUZE/BANDAGES/DRESSINGS) ×1 IMPLANT
STRIP CLOSURE SKIN 1/2X4 (GAUZE/BANDAGES/DRESSINGS) ×1 IMPLANT
SURGILUBE 2OZ TUBE FLIPTOP (MISCELLANEOUS) ×1 IMPLANT
SUT ETHILON 3-0 (SUTURE) IMPLANT
SUT ETHILON 4-0 (SUTURE) ×2
SUT ETHILON 4-0 FS2 18XMFL BLK (SUTURE) ×1
SUT VIC AB 2-0 CT1 (SUTURE) ×1 IMPLANT
SUT VIC AB 3-0 SH 27 (SUTURE) ×2
SUT VIC AB 3-0 SH 27X BRD (SUTURE) ×1 IMPLANT
SUT VIC AB 4-0 FS2 27 (SUTURE) ×1 IMPLANT
SUT VICRYL+ 3-0 144IN (SUTURE) ×1 IMPLANT
SUTURE ETHLN 4-0 FS2 18XMF BLK (SUTURE) ×1 IMPLANT
SWABSTK COMLB BENZOIN TINCTURE (MISCELLANEOUS) ×1 IMPLANT

## 2020-04-05 NOTE — Discharge Instructions (Signed)

## 2020-04-05 NOTE — Op Note (Addendum)
Preoperative diagnosis: Right posterior calf mass, possible keratoacanthoma versus basal cell carcinoma.  Postoperative diagnosis: Same.  Operative procedure: Excision of right posterior calf mass with ACell graft application.  Operating surgeon: Hervey Ard, MD.  Anesthesia: Attended local, 0.5% Marcaine with 1: 200,000 units of epinephrine.  Estimated blood loss: Less than 5 cc.  Clinical note: This 86 year old woman has developed a painful mass on the posterior aspect of the right calf.  Examination in the office was exquisitely tender.  She was brought to the operating room for planned excision.  Due to its location and the lack of adjacent skin for mobilization was elected to have this closed with an ACell graft coverage.  The patient received Ancef prior to the procedure.  Operative note: With the patient comfortably supine and padded in the left lateral decubitus position the right calf and ankle was cleansed with ChloraPrep and draped.  A 3 x 6 cm elliptical incision was utilized to remove the area with 3 mm margins.  The lesion was tagged with a short suture laterally and a long suture superiorly.  The lesion was excised including the dermis and some of the subcutaneous fat.  This was not excised down to fascia.  Bleeding points were controlled with electrocautery.  A 5 x 5 cm multilayer ACell graft was anchored around the wound with interrupted 3-0 Vicryl sutures.  A single suture was placed into the base of the graft material and into the base of the wound.  K-Y jelly soaked cotton balls were used to compress the graft against the defect followed by Telfa pad, Kerlix wrap, Unna boot and light Ace wrap.  The patient tolerated the procedure well and was taken recovery in stable condition.

## 2020-04-05 NOTE — H&P (Signed)
Melanie Simpson [id number] Dec 30, 1923     HPI:  Painful right posterior calf lesion, question of keratoacanthoma vs skin cancer. For excison. Size will likely preclude primary closure.   Medications Prior to Admission  Medication Sig Dispense Refill Last Dose  . acetaminophen (TYLENOL) 500 MG tablet Take 500 mg by mouth every 6 (six) hours as needed for moderate pain or headache.   Past Week at Unknown time  . Cyanocobalamin (B-12 COMPLIANCE INJECTION IJ) Inject 1 Dose as directed every 30 (thirty) days.   Past Week at Unknown time  . Cyanocobalamin (B-12 PO) Take 1 tablet by mouth daily.   Past Week at Unknown time   No Known Allergies Past Medical History:  Diagnosis Date  . Allergy   . Anemia   . Arthritis   . Cancer [facility])    SKIN CANCER  . Dementia ([place])   . GERD (gastroesophageal reflux disease)   . Hypertension    H/O WAS ON LISINOPRIL-CURRENTLY ON NO MEDS   Past Surgical History:  Procedure Laterality Date  . NO PAST SURGERIES     Social History   Socioeconomic History  . Marital status: Widowed    Spouse name: Not on file  . Number of children: Not on file  . Years of education: Not on file  . Highest education level: Not on file  Occupational History  . Not on file  Tobacco Use  . Smoking status: Never Smoker  . Smokeless tobacco: Never Used  Vaping Use  . Vaping Use: Never used  Substance and Sexual Activity  . Alcohol use: Not Currently    Alcohol/week: 0.0 standard drinks  . Drug use: No  . Sexual activity: Not on file  Other Topics Concern  . Not on file  Social History Narrative  . Not on file   Social Determinants of Health   Financial Resource Strain:   . Difficulty of Paying Living Expenses:   Food Insecurity:   . Worried About Charity fundraiser in the Last Year:   . Arboriculturist in the Last Year:   Transportation Needs:   . Film/video editor (Medical):   Marland Kitchen Lack of Transportation (Non-Medical):   Physical Activity:   . Days  of Exercise per Week:   . Minutes of Exercise per Session:   Stress:   . Feeling of Stress :   Social Connections:   . Frequency of Communication with Friends and Family:   . Frequency of Social Gatherings with Friends and Family:   . Attends Religious Services:   . Active Member of Clubs or Organizations:   . Attends Archivist Meetings:   Marland Kitchen Marital Status:   Intimate Partner Violence:   . Fear of Current or Ex-Partner:   . Emotionally Abused:   Marland Kitchen Physically Abused:   . Sexually Abused:    Social History   Social History Narrative  . Not on file     ROS: Negative.     PE: HEENT: Negative. Lungs: Clear. Cardio: RR.  Assessment/Plan:  Proceed with planned excision right posterior calf lesion.     Forest Gleason Kerby Borner 04/05/2020

## 2020-04-05 NOTE — Transfer of Care (Signed)
Immediate Anesthesia Transfer of Care Note  Patient: Melanie Simpson  Procedure(s) Performed: EXCISION MASS (Right Leg Lower)  Patient Location: PACU  Anesthesia Type:General  Level of Consciousness: awake, alert  and oriented  Airway & Oxygen Therapy: Patient Spontanous Breathing  Post-op Assessment: Report given to RN  Post vital signs: Reviewed and stable  Last Vitals:  Vitals Value Taken Time  BP 155/61 04/05/20 1236  Temp    Pulse 68 04/05/20 1236  Resp 10 04/05/20 1236  SpO2 98 % 04/05/20 1236    Last Pain:  Vitals:   04/05/20 1041  TempSrc: Temporal  PainSc: 7          Complications: No complications documented.

## 2020-04-05 NOTE — Anesthesia Preprocedure Evaluation (Addendum)
Anesthesia Evaluation  Patient identified by MRN, date of birth, ID band Patient awake    Reviewed: Allergy & Precautions, H&P , NPO status , Patient's Chart, lab work & pertinent test results  Airway Mallampati: III       Dental   Pulmonary asthma ,    breath sounds clear to auscultation       Cardiovascular hypertension, (-) angina(-) Past MI (-) dysrhythmias  Rhythm:regular Rate:Normal  Chronic LE edema for years, denies CHF (no echo available)   Neuro/Psych PSYCHIATRIC DISORDERS Dementia negative neurological ROS     GI/Hepatic Neg liver ROS, GERD  ,  Endo/Other  Hypothyroidism   Renal/GU negative Renal ROS  negative genitourinary   Musculoskeletal   Abdominal   Peds  Hematology  (+) Blood dyscrasia, anemia ,   Anesthesia Other Findings Past Medical History: No date: Allergy No date: Anemia No date: Arthritis No date: Cancer [facility])     Comment:  SKIN CANCER No date: Dementia (HCC) No date: GERD (gastroesophageal reflux disease) No date: Hypertension     Comment:  H/O WAS ON LISINOPRIL-CURRENTLY ON NO MEDS  Past Surgical History: No date: NO PAST SURGERIES     Reproductive/Obstetrics negative OB ROS                            Anesthesia Physical Anesthesia Plan  ASA: II  Anesthesia Plan: General   Post-op Pain Management:    Induction:   PONV Risk Score and Plan: Propofol infusion and TIVA  Airway Management Planned: Simple Face Mask  Additional Equipment:   Intra-op Plan:   Post-operative Plan:   Informed Consent: I have reviewed the patients History and Physical, chart, labs and discussed the procedure including the risks, benefits and alternatives for the proposed anesthesia with the patient or authorized representative who has indicated his/her understanding and acceptance.     Dental Advisory Given  Plan Discussed with: Anesthesiologist, CRNA and  Surgeon  Anesthesia Plan Comments: (Consent obtained from pt's son.  KR)       Anesthesia Quick Evaluation

## 2020-04-06 ENCOUNTER — Encounter: Payer: Self-pay | Admitting: General Surgery

## 2020-04-06 NOTE — Anesthesia Postprocedure Evaluation (Signed)
Anesthesia Post Note  Patient: Melanie Simpson  Procedure(s) Performed: EXCISION MASS (Right Leg Lower)  Patient location during evaluation: PACU Anesthesia Type: General Level of consciousness: awake and alert Pain management: pain level controlled Vital Signs Assessment: post-procedure vital signs reviewed and stable Respiratory status: spontaneous breathing, nonlabored ventilation and respiratory function stable Cardiovascular status: blood pressure returned to baseline and stable Postop Assessment: no apparent nausea or vomiting Anesthetic complications: no   No complications documented.   Last Vitals:  Vitals:   04/05/20 1344 04/05/20 1345  BP:  (!) 148/59  Pulse:  61  Resp: 16 16  Temp:    SpO2:  100%    Last Pain:  Vitals:   04/05/20 1344  TempSrc:   PainSc: 0-No pain                 Brett Canales Tyrianna Lightle

## 2020-04-09 LAB — SURGICAL PATHOLOGY

## 2020-04-26 ENCOUNTER — Other Ambulatory Visit: Payer: Self-pay | Admitting: Internal Medicine

## 2020-04-26 DIAGNOSIS — S22000A Wedge compression fracture of unspecified thoracic vertebra, initial encounter for closed fracture: Secondary | ICD-10-CM

## 2021-02-12 ENCOUNTER — Emergency Department: Payer: Medicare Other

## 2021-02-12 ENCOUNTER — Other Ambulatory Visit: Payer: Self-pay

## 2021-02-12 ENCOUNTER — Inpatient Hospital Stay
Admission: EM | Admit: 2021-02-12 | Discharge: 2021-02-21 | DRG: 065 | Disposition: A | Discharge status: Skilled Nursing Facility | Payer: Medicare Other | Attending: Internal Medicine | Admitting: Internal Medicine

## 2021-02-12 DIAGNOSIS — G8194 Hemiplegia, unspecified affecting left nondominant side: Secondary | ICD-10-CM | POA: Diagnosis present

## 2021-02-12 DIAGNOSIS — F0390 Unspecified dementia without behavioral disturbance: Secondary | ICD-10-CM

## 2021-02-12 DIAGNOSIS — E039 Hypothyroidism, unspecified: Secondary | ICD-10-CM | POA: Diagnosis present

## 2021-02-12 DIAGNOSIS — R29810 Facial weakness: Secondary | ICD-10-CM | POA: Diagnosis present

## 2021-02-12 DIAGNOSIS — E785 Hyperlipidemia, unspecified: Secondary | ICD-10-CM | POA: Diagnosis present

## 2021-02-12 DIAGNOSIS — Z20822 Contact with and (suspected) exposure to covid-19: Secondary | ICD-10-CM | POA: Diagnosis present

## 2021-02-12 DIAGNOSIS — Z8249 Family history of ischemic heart disease and other diseases of the circulatory system: Secondary | ICD-10-CM

## 2021-02-12 DIAGNOSIS — I639 Cerebral infarction, unspecified: Principal | ICD-10-CM | POA: Diagnosis present

## 2021-02-12 DIAGNOSIS — K219 Gastro-esophageal reflux disease without esophagitis: Secondary | ICD-10-CM | POA: Diagnosis present

## 2021-02-12 DIAGNOSIS — J302 Other seasonal allergic rhinitis: Secondary | ICD-10-CM | POA: Diagnosis present

## 2021-02-12 DIAGNOSIS — Z85828 Personal history of other malignant neoplasm of skin: Secondary | ICD-10-CM

## 2021-02-12 DIAGNOSIS — Z66 Do not resuscitate: Secondary | ICD-10-CM | POA: Diagnosis present

## 2021-02-12 DIAGNOSIS — I10 Essential (primary) hypertension: Secondary | ICD-10-CM | POA: Diagnosis present

## 2021-02-12 DIAGNOSIS — Z79899 Other long term (current) drug therapy: Secondary | ICD-10-CM

## 2021-02-12 DIAGNOSIS — M1990 Unspecified osteoarthritis, unspecified site: Secondary | ICD-10-CM | POA: Diagnosis present

## 2021-02-12 DIAGNOSIS — M810 Age-related osteoporosis without current pathological fracture: Secondary | ICD-10-CM | POA: Diagnosis present

## 2021-02-12 DIAGNOSIS — R4781 Slurred speech: Secondary | ICD-10-CM | POA: Diagnosis present

## 2021-02-12 LAB — CBG MONITORING, ED: Glucose-Capillary: 100 mg/dL — ABNORMAL HIGH (ref 70–99)

## 2021-02-12 LAB — CBC
HCT: 37.9 % (ref 36.0–46.0)
Hemoglobin: 13.4 g/dL (ref 12.0–15.0)
MCH: 30.8 pg (ref 26.0–34.0)
MCHC: 35.4 g/dL (ref 30.0–36.0)
MCV: 87.1 fL (ref 80.0–100.0)
Platelets: 160 10*3/uL (ref 150–400)
RBC: 4.35 MIL/uL (ref 3.87–5.11)
RDW: 13.1 % (ref 11.5–15.5)
WBC: 3.8 10*3/uL — ABNORMAL LOW (ref 4.0–10.5)
nRBC: 0 % (ref 0.0–0.2)

## 2021-02-12 LAB — BASIC METABOLIC PANEL
Anion gap: 6 (ref 5–15)
BUN: 14 mg/dL (ref 8–23)
CO2: 23 mmol/L (ref 22–32)
Calcium: 8.9 mg/dL (ref 8.9–10.3)
Chloride: 105 mmol/L (ref 98–111)
Creatinine, Ser: 0.66 mg/dL (ref 0.44–1.00)
GFR, Estimated: >60 mL/min (ref >60)
Glucose, Bld: 105 mg/dL — ABNORMAL HIGH (ref 70–99)
Potassium: 3.9 mmol/L (ref 3.5–5.1)
Sodium: 134 mmol/L — ABNORMAL LOW (ref 135–145)

## 2021-02-12 NOTE — ED Provider Notes (Signed)
[facility] Emergency Department Provider Note   ____________________________________________   I have reviewed the triage vital signs and the nursing notes.   HISTORY  Chief Complaint Drooling and Weakness   History limited by and level 5 caveat due to: Dementia. History primarily obtained from son at bedside  HPI Melanie Simpson is a 87 y.o. female who presents to the emergency department today because of concerns for left facial droop and weakness.  This states that 2 nights ago he noticed that the patient started having some left facial droop.  Also had a some slurred speech and difficulty with ambulation.  Over this time the speech has slightly improved although the patient is continued to have difficulty with ambulation.  Furthermore and she continues to have left facial droop.  No reported trauma.  Patient does occasionally complain of pain everywhere but no focal pain recently. Patient has not had similar symptoms in the past.  Records reviewed. Per medical record review patient has a history of dementia.   Past Medical History:  Diagnosis Date  . Allergy   . Anemia   . Arthritis   . Cancer [facility])    SKIN CANCER  . Dementia ([place])   . GERD (gastroesophageal reflux disease)   . Hypertension    H/O WAS ON LISINOPRIL-CURRENTLY ON NO MEDS    Patient Active Problem List   Diagnosis Date Noted  . Thrombocytopenia ([place]) 06/24/2017  . GERD (gastroesophageal reflux disease) 03/11/2017  . Senile osteoporosis 02/28/2015  . Hypothyroid 02/28/2015  . Hyperlipemia 02/28/2015  . Stricture and stenosis of esophagus 02/28/2015  . Nocturnal and diurnal enuresis 02/28/2015  . Seasonal allergic rhinitis 02/28/2015  . Allergic asthma 02/28/2015  . Arthritis 02/28/2015  . Varicella 02/28/2015  . Essential hypertension 02/28/2015  . Gravida 3 para 3 02/28/2015  . Absence of bladder continence 02/28/2015  . Allergic rhinitis, seasonal 02/28/2015    Past  Surgical History:  Procedure Laterality Date  . MASS EXCISION Right 04/05/2020   Procedure: EXCISION MASS;  Surgeon: Robert Bellow, MD;  Location: [facility] ORS;  Service: General;  Laterality: Right;  right leg  . NO PAST SURGERIES      Prior to Admission medications   Medication Sig Start Date End Date Taking? Authorizing Provider  acetaminophen (TYLENOL) 500 MG tablet Take 500 mg by mouth every 6 (six) hours as needed for moderate pain or headache.    [provider]  Cyanocobalamin (B-12 COMPLIANCE INJECTION IJ) Inject 1 Dose as directed every 30 (thirty) days.    [provider]  Cyanocobalamin (B-12 PO) Take 1 tablet by mouth daily.    [provider]  HYDROcodone-acetaminophen (NORCO/VICODIN) 5-325 MG tablet 1/2-1 tablet every 4-6 hours if needed for pain. 04/05/20   Robert Bellow, MD    Allergies Patient has no known allergies.  Family History  Problem Relation Age of Onset  . Heart disease Father   . Cancer Other        breast  . Diabetes Sister   . Diabetes Son     Social History Social History   Tobacco Use  . Smoking status: Never Smoker  . Smokeless tobacco: Never Used  Vaping Use  . Vaping Use: Never used  Substance Use Topics  . Alcohol use: Not Currently    Alcohol/week: 0.0 standard drinks  . Drug use: No    Review of Systems Unable to obtain reliable ROS secondary to dementia.  ____________________________________________   PHYSICAL EXAM:  VITAL SIGNS:  ED Triage Vitals  Enc Vitals Group     BP 02/12/21 1716 (!) 159/73     Pulse Rate 02/12/21 1716 76     Resp 02/12/21 1716 16     Temp 02/12/21 1716 98.4 F (36.9 C)     Temp Source 02/12/21 1716 Oral     SpO2 02/12/21 1716 97 %     Weight 02/12/21 1718 140 lb (63.5 kg)     Height 02/12/21 1718 5\' 3"  (1.6 m)   Constitutional: Awake and alert. Not completely oriented.  Eyes: Conjunctivae are normal.  ENT      Head: Normocephalic and atraumatic.      Nose: No  congestion/rhinnorhea.      Mouth/Throat: Mucous membranes are moist.      Neck: No stridor. Hematological/Lymphatic/Immunilogical: No cervical lymphadenopathy. Cardiovascular: Normal rate, regular rhythm.  No murmurs, rubs, or gallops.  Respiratory: Normal respiratory effort without tachypnea nor retractions. Breath sounds are clear and equal bilaterally. No wheezes/rales/rhonchi. Gastrointestinal: Soft and non tender. No rebound. No guarding.  Genitourinary: Deferred Musculoskeletal: Normal range of motion in all extremities. No lower extremity edema. Neurologic:  Dementia. Left facial droop. Able to move all extremities.  Skin:  Skin is warm, dry and intact. No rash noted. ____________________________________________    LABS (pertinent positives/negatives)  BMP wnl except na 134, glu 105 CBC wbc 3.8, hgb 13.4, plt 160  ____________________________________________   EKG  I, Nance Pear, attending physician, personally viewed and interpreted this EKG  EKG Time: 1720 Rate: 69 Rhythm: normal sinus rhythm Axis: normal Intervals: qtc 426 QRS: narrow ST changes: no st elevation Impression: normal ekg   ____________________________________________    RADIOLOGY  CT head No acute abnromality  ____________________________________________   PROCEDURES  Procedures  ____________________________________________   INITIAL IMPRESSION / ASSESSMENT AND PLAN / ED COURSE  Pertinent labs & imaging results that were available during my care of the patient were reviewed by me and considered in my medical decision making (see chart for details).   Patient presents to the emergency department today because of concerns for facial drooping, leg weakness.  The symptoms started 2 nights ago.  On exam patient does have left facial droop.  CT scan without any acute abnormalities however do have concerns for CVA.  Will obtain  MRI.   ____________________________________________   FINAL CLINICAL IMPRESSION(S) / ED DIAGNOSES  Left facial droop  Note: This dictation was prepared with Dragon dictation. Any transcriptional errors that result from this process are unintentional     Nance Pear, MD 02/12/21 2304

## 2021-02-12 NOTE — ED Triage Notes (Signed)
Pt arrives to ER with son. L sided facial droop with drooling noted. Son states a few days ago pt was able to walk by self but a few days ago was dragging her foot. Son states pt was c/o L sided head pain. Pt tells this RN her abd hurts. Son states she c/o of pain everywhere all the time. Son states that her speech sounds better today then 2 days ago. States medics came to house last night and recommend come to ER but son wanted to talk to PCP. Talked to PCP today who said to come to ER.

## 2021-02-13 ENCOUNTER — Observation Stay
Admit: 2021-02-13 | Discharge: 2021-02-13 | Disposition: A | Discharge status: Home / Self Care | Payer: Medicare Other | Attending: Internal Medicine | Admitting: Internal Medicine

## 2021-02-13 ENCOUNTER — Observation Stay: Payer: Medicare Other

## 2021-02-13 DIAGNOSIS — R29810 Facial weakness: Secondary | ICD-10-CM | POA: Diagnosis present

## 2021-02-13 DIAGNOSIS — K219 Gastro-esophageal reflux disease without esophagitis: Secondary | ICD-10-CM | POA: Diagnosis present

## 2021-02-13 DIAGNOSIS — E039 Hypothyroidism, unspecified: Secondary | ICD-10-CM | POA: Diagnosis present

## 2021-02-13 DIAGNOSIS — G309 Alzheimer's disease, unspecified: Secondary | ICD-10-CM

## 2021-02-13 DIAGNOSIS — I10 Essential (primary) hypertension: Secondary | ICD-10-CM | POA: Diagnosis present

## 2021-02-13 DIAGNOSIS — F0390 Unspecified dementia without behavioral disturbance: Secondary | ICD-10-CM | POA: Diagnosis present

## 2021-02-13 DIAGNOSIS — M1990 Unspecified osteoarthritis, unspecified site: Secondary | ICD-10-CM | POA: Diagnosis present

## 2021-02-13 DIAGNOSIS — F0280 Dementia in other diseases classified elsewhere without behavioral disturbance: Secondary | ICD-10-CM | POA: Diagnosis not present

## 2021-02-13 DIAGNOSIS — Z85828 Personal history of other malignant neoplasm of skin: Secondary | ICD-10-CM | POA: Diagnosis not present

## 2021-02-13 DIAGNOSIS — Z8249 Family history of ischemic heart disease and other diseases of the circulatory system: Secondary | ICD-10-CM | POA: Diagnosis not present

## 2021-02-13 DIAGNOSIS — R4781 Slurred speech: Secondary | ICD-10-CM | POA: Diagnosis present

## 2021-02-13 DIAGNOSIS — I639 Cerebral infarction, unspecified: Secondary | ICD-10-CM | POA: Diagnosis present

## 2021-02-13 DIAGNOSIS — E7849 Other hyperlipidemia: Secondary | ICD-10-CM

## 2021-02-13 DIAGNOSIS — Z79899 Other long term (current) drug therapy: Secondary | ICD-10-CM | POA: Diagnosis not present

## 2021-02-13 DIAGNOSIS — M810 Age-related osteoporosis without current pathological fracture: Secondary | ICD-10-CM | POA: Diagnosis present

## 2021-02-13 DIAGNOSIS — Z66 Do not resuscitate: Secondary | ICD-10-CM | POA: Diagnosis present

## 2021-02-13 DIAGNOSIS — G8194 Hemiplegia, unspecified affecting left nondominant side: Secondary | ICD-10-CM | POA: Diagnosis present

## 2021-02-13 DIAGNOSIS — Z20822 Contact with and (suspected) exposure to covid-19: Secondary | ICD-10-CM | POA: Diagnosis present

## 2021-02-13 DIAGNOSIS — E785 Hyperlipidemia, unspecified: Secondary | ICD-10-CM | POA: Diagnosis present

## 2021-02-13 DIAGNOSIS — J302 Other seasonal allergic rhinitis: Secondary | ICD-10-CM | POA: Diagnosis present

## 2021-02-13 LAB — RESP PANEL BY RT-PCR (FLU A&B, COVID) ARPGX2
Influenza A by PCR: NEGATIVE
Influenza B by PCR: NEGATIVE
SARS Coronavirus 2 by RT PCR: NEGATIVE

## 2021-02-13 LAB — ECHOCARDIOGRAM COMPLETE
AR max vel: 2.78 cm2
AV Area VTI: 2.68 cm2
AV Area mean vel: 2.63 cm2
AV Mean grad: 4 mmHg
AV Peak grad: 7 mmHg
Ao pk vel: 1.32 m/s
Area-P 1/2: 5.97 cm2
Height: 63 in
MV VTI: 2.23 cm2
S' Lateral: 2.3 cm
Weight: 2317.48 oz

## 2021-02-13 LAB — URINALYSIS, COMPLETE (UACMP) WITH MICROSCOPIC
Bilirubin Urine: NEGATIVE
Glucose, UA: NEGATIVE mg/dL
Hgb urine dipstick: NEGATIVE
Ketones, ur: 5 mg/dL — AB
Leukocytes,Ua: NEGATIVE
Nitrite: NEGATIVE
Protein, ur: NEGATIVE mg/dL
Specific Gravity, Urine: 1.008 (ref 1.005–1.030)
pH: 7 (ref 5.0–8.0)

## 2021-02-13 LAB — LIPID PANEL
Cholesterol: 284 mg/dL — ABNORMAL HIGH (ref 0–200)
HDL: 72 mg/dL (ref >40)
LDL Cholesterol: 199 mg/dL — ABNORMAL HIGH (ref 0–99)
Total CHOL/HDL Ratio: 3.9 RATIO
Triglycerides: 65 mg/dL (ref <150)
VLDL: 13 mg/dL (ref 0–40)

## 2021-02-13 LAB — CREATININE, SERUM
Creatinine, Ser: 0.72 mg/dL (ref 0.44–1.00)
GFR, Estimated: >60 mL/min (ref >60)

## 2021-02-13 LAB — CBC
HCT: 40.1 % (ref 36.0–46.0)
Hemoglobin: 14 g/dL (ref 12.0–15.0)
MCH: 31 pg (ref 26.0–34.0)
MCHC: 34.9 g/dL (ref 30.0–36.0)
MCV: 88.7 fL (ref 80.0–100.0)
Platelets: 163 10*3/uL (ref 150–400)
RBC: 4.52 MIL/uL (ref 3.87–5.11)
RDW: 12.9 % (ref 11.5–15.5)
WBC: 5.1 10*3/uL (ref 4.0–10.5)
nRBC: 0 % (ref 0.0–0.2)

## 2021-02-13 MED ORDER — STROKE: EARLY STAGES OF RECOVERY BOOK: Freq: Once | Status: AC

## 2021-02-13 MED ORDER — ENOXAPARIN SODIUM 40 MG/0.4ML IJ SOSY
40.0000 mg | PREFILLED_SYRINGE | INTRAMUSCULAR | Status: DC
  Administered 2021-02-13 – 2021-02-21 (×7): 40 mg via SUBCUTANEOUS
  Filled 2021-02-13 (×9): qty 0.4

## 2021-02-13 MED ORDER — ACETAMINOPHEN 650 MG RE SUPP: 650.0000 mg | RECTAL | Status: DC | PRN

## 2021-02-13 MED ORDER — CLOPIDOGREL BISULFATE 75 MG PO TABS
75.0000 mg | ORAL_TABLET | Freq: Every day | ORAL | Status: DC
  Administered 2021-02-14 – 2021-02-21 (×8): 75 mg via ORAL
  Filled 2021-02-13 (×8): qty 1

## 2021-02-13 MED ORDER — ACETAMINOPHEN 160 MG/5ML PO SOLN
650.0000 mg | ORAL | Status: DC | PRN
  Filled 2021-02-13: qty 20.3

## 2021-02-13 MED ORDER — SODIUM CHLORIDE 0.9 % IV SOLN: INTRAVENOUS | Status: DC

## 2021-02-13 MED ORDER — ATORVASTATIN CALCIUM 20 MG PO TABS
10.0000 mg | ORAL_TABLET | Freq: Every day | ORAL | Status: DC
  Administered 2021-02-13 – 2021-02-20 (×7): 10 mg via ORAL
  Filled 2021-02-13 (×8): qty 1

## 2021-02-13 MED ORDER — ATORVASTATIN CALCIUM 10 MG PO TABS: 10.0000 mg | ORAL_TABLET | Freq: Every day | ORAL | Status: DC

## 2021-02-13 MED ORDER — ACETAMINOPHEN 325 MG PO TABS
650.0000 mg | ORAL_TABLET | ORAL | Status: DC | PRN
  Administered 2021-02-19: 05:00:00 650 mg via ORAL
  Filled 2021-02-13: qty 2

## 2021-02-13 MED ORDER — CLOPIDOGREL BISULFATE 75 MG PO TABS
300.0000 mg | ORAL_TABLET | Freq: Once | ORAL | Status: AC
  Administered 2021-02-13: 300 mg via ORAL
  Filled 2021-02-13: qty 4

## 2021-02-13 MED ORDER — ASPIRIN 81 MG PO CHEW
CHEWABLE_TABLET | ORAL | Status: AC
  Administered 2021-02-13: 324 mg via ORAL
  Filled 2021-02-13: qty 4

## 2021-02-13 MED ORDER — ASPIRIN EC 81 MG PO TBEC
81.0000 mg | DELAYED_RELEASE_TABLET | Freq: Every day | ORAL | Status: DC
  Administered 2021-02-13 – 2021-02-21 (×9): 81 mg via ORAL
  Filled 2021-02-13 (×9): qty 1

## 2021-02-13 NOTE — Evaluation (Signed)
Physical Therapy Evaluation Patient Details Name: Melanie Simpson MRN: [id number] DOB: March 24, 1924 Today's Date: 02/13/2021   History of Present Illness  Pt is a 87 yo female that presented to the emergency department because of concerns for left facial droop and weakness. MRI showed "1.5 cm acute ischemic right basal ganglia infarct." PMH of dementia, GERD, HTN.   Clinical Impression  Patient easily wakes at start of session, unable/unwilling to answer orientation questions. Able to follow simple one step commands and visual cueing. Per chart review pt lives with her son, previously ambulatory with [facility] and more recently a RW.   Upon assessment the patient was able to move all limbs against gravity, but seemed more effortful for LUE and LLE. Coordination of UE/LE appeared intact. Pt exhibited difficulty with initiation and sequencing of tasks. Supine to sit with minA, pt able scoot anterior/posteriorly on surfaces with time and cueing. Sit <> stand attempted with RW and modA, posterior lean noted throughout. Squat pivot transfer to recliner with modA. Pt able to name several items related to lunch tray when prompted as well.  Overall the patient demonstrated deficits (see "PT Problem List") that impede the patient's functional abilities, safety, and mobility and would benefit from skilled PT intervention. Recommendation is SNF due to current level of assistance needed and decline in functional mobility from PLOF.     Follow Up Recommendations SNF    Equipment Recommendations  Other (comment) (TBD at next venue of care)    Recommendations for Other Services       Precautions / Restrictions Precautions Precautions: Fall Restrictions Weight Bearing Restrictions: No      Mobility  Bed Mobility Overal bed mobility: Needs Assistance Bed Mobility: Supine to Sit     Supine to sit: Min assist;HOB elevated     General bed mobility comments: scooting to EOB effortful for pt, verbal/tactile  cues for initiation    Transfers Overall transfer level: Needs assistance Equipment used: Rolling walker (2 wheeled);None Transfers: Sit to/from [facility] Sit to Stand: Mod assist Stand pivot transfers: Mod assist       General transfer comment: sit to stand with RW, modA due to posterior lean  Ambulation/Gait             General Gait Details: deferred  Stairs            Wheelchair Mobility    Modified Rankin (Stroke Patients Only)       Balance Overall balance assessment: Needs assistance Sitting-balance support: Feet supported Sitting balance-Leahy Scale: Poor Sitting balance - Comments: Patient with varying sitting balance. intermittently propped with unilateral UE support. L lateral lean noted Postural control: Left lateral lean Standing balance support: Bilateral upper extremity supported Standing balance-Leahy Scale: Poor Standing balance comment: posterior lean noted throughout standing                             Pertinent Vitals/Pain Pain Assessment: Faces Faces Pain Scale: No hurt    Home Living                   Additional Comments: Pt unreliable historian    Prior Function Level of Independence: Needs assistance         Comments: no family at bedside to confirm PLOF. Per chart review pt lives with her son, and was ambulatory with [facility], more recently a walker     Hand Dominance        Extremity/Trunk  Assessment   Upper Extremity Assessment Upper Extremity Assessment: RUE deficits/detail;LUE deficits/detail;Difficult to assess due to impaired cognition RUE Deficits / Details: able to lift against gravity, able to push/pull on PT. symmetrical strength RUE Coordination: WNL LUE Deficits / Details: able to lift against gravity, able to push/pull on PT. symmetrical strength    Lower Extremity Assessment Lower Extremity Assessment: RLE deficits/detail;LLE deficits/detail;Generalized weakness RLE  Deficits / Details: pt able to lift against gravity and mimic PT marching, LAQ, ankle DF/PF RLE Coordination: WNL LLE Deficits / Details: pt able to lift against gravity and mimic PT marching, LAQ, ankle DF/PF though more effortful on L than R LLE Sensation:  (unclear light touch sensation assessment results) LLE Coordination: WNL    Cervical / Trunk Assessment Cervical / Trunk Assessment: Kyphotic  Communication   Communication: No difficulties  Cognition Arousal/Alertness: Awake/alert Behavior During Therapy: Flat affect;WFL for tasks assessed/performed Overall Cognitive Status: No family/caregiver present to determine baseline cognitive functioning                                 General Comments: pt did not answer orientation questions      General Comments      Exercises Other Exercises Other Exercises: Pt set up with lunch tray. able to name items, visually track items across midline bilaterally. When prompted to reach for a fork, was able to reach and pick it up.   Assessment/Plan    PT Assessment Patient needs continued PT services  PT Problem List Decreased strength;Decreased mobility;Decreased safety awareness;Decreased knowledge of precautions;Decreased activity tolerance;Decreased balance;Decreased knowledge of use of DME       PT Treatment Interventions DME instruction;Therapeutic exercise;Gait training;Balance training;Stair training;Neuromuscular re-education;Functional mobility training;Therapeutic activities;Patient/family education    PT Goals (Current goals can be found in the Care Plan section)  Acute Rehab PT Goals PT Goal Formulation: Patient unable to participate in goal setting Time For Goal Achievement: 02/27/21 Potential to Achieve Goals: Fair    Frequency 7X/week   Barriers to discharge        Co-evaluation               AM-PAC PT "6 Clicks" Mobility  Outcome Measure Help needed turning from your back to your side while  in a flat bed without using bedrails?: A Little Help needed moving from lying on your back to sitting on the side of a flat bed without using bedrails?: A Lot Help needed moving to and from a bed to a chair (including a wheelchair)?: A Lot Help needed standing up from a chair using your arms (e.g., wheelchair or bedside chair)?: A Lot Help needed to walk in hospital room?: Total Help needed climbing 3-5 steps with a railing? : Total 6 Click Score: 11    End of Session Equipment Utilized During Treatment: Gait belt Activity Tolerance: Patient tolerated treatment well Patient left: in chair;with chair alarm set;with call bell/phone within reach Nurse Communication: Mobility status;Other (comment) (request for posey apron) PT Visit Diagnosis: Other abnormalities of gait and mobility (R26.89);Difficulty in walking, not elsewhere classified (R26.2);Muscle weakness (generalized) (M62.81);Unsteadiness on feet (R26.81)    Time: 8676-7209 PT Time Calculation (min) (ACUTE ONLY): 27 min   Charges:   PT Evaluation $PT Eval Low Complexity: 1 Low PT Treatments $Therapeutic Activity: 23-37 mins       Lieutenant Diego PT, DPT 2:28 PM,02/13/21

## 2021-02-13 NOTE — Progress Notes (Signed)
Speech Language Pathology Evaluation Patient Details Name: Dezhane Staten MRN: [id number] DOB: 05/28/1924 Today's Date: 02/13/2021 Time: 1400-1420 SLP Time Calculation (min) (ACUTE ONLY): 20 min  Problem List:  Patient Active Problem List   Diagnosis Date Noted   Acute CVA (cerebrovascular accident) ([place]) 02/13/2021   Dementia without behavioral disturbance ([place]) 02/13/2021   Stroke ([place]) 02/13/2021   Venous insufficiency 02/28/2020   Thrombocytopenia ([place]) 06/24/2017   GERD (gastroesophageal reflux disease) 03/11/2017   Senile osteoporosis 02/28/2015   Hypothyroid 02/28/2015   Hyperlipemia 02/28/2015   Stricture and stenosis of esophagus 02/28/2015   Nocturnal and diurnal enuresis 02/28/2015   Seasonal allergic rhinitis 02/28/2015   Allergic asthma 02/28/2015   Arthritis 02/28/2015   Varicella 02/28/2015   Essential hypertension 02/28/2015   Gravida 3 para 3 02/28/2015   Absence of bladder continence 02/28/2015   Allergic rhinitis, seasonal 02/28/2015   Past Medical History:  Past Medical History:  Diagnosis Date   Allergy    Anemia    Arthritis    Cancer ([place])    SKIN CANCER   Dementia ([place])    GERD (gastroesophageal reflux disease)    Hypertension    H/O WAS ON LISINOPRIL-CURRENTLY ON NO MEDS   Past Surgical History:  Past Surgical History:  Procedure Laterality Date   MASS EXCISION Right 04/05/2020   Procedure: EXCISION MASS;  Surgeon: Robert Bellow, MD;  Location: [facility] ORS;  Service: General;  Laterality: Right;  right leg   NO PAST SURGERIES     HPI:  87 year old female with HTN, dementia came in with 2 days of difficulty ambulating as well as left facial droop. MRI 02/13/21 showed 1.5 cm acute ischemic right basal ganglia infarct. Moderately advanced temporal lobe predominant cerebral atrophy with chronic small vessel ischemic disease. Passed [employer] Screen.   Assessment / Plan / Recommendation Clinical Impression   Patient presents with  moderate-severe cognitive communication deficits which SLP judges are due in large part to baseline dementia. Patient was pleasant and cooperative, and followed one-step commands consistently. Left facial droop is noted however no apparent dysarthria. Patient was able to answer questions with extended time (per pt she goes by "Vivien Rota," is from [place], and has 2 children). She is confused, oriented to self only, has difficulties with functional problem solving, immediate and delayed recall, and sustained attention (and therefore higher level functions). She scored 9/30 on the MMSE, consistent with dementia (26 and above is WNL). With assistance and removal of other distracting items, patient was able to self-feed, although she needed assistance for chopping meats. D/w nursing, recommend chopping meats for cognitive reasons to increase independence with feeding; no swallow evaluation is indicated at this time as patient tolerating regular consistencies after passing [place] per RN. No acute SLP cognitive needs are identified; SLP will s/o.     SLP Assessment  SLP Recommendation/Assessment: (P) All further Speech Lanaguage Pathology  needs can be addressed in the next venue of care SLP Visit Diagnosis: (P) Cognitive communication deficit (R41.841)    Follow Up Recommendations  (P) Skilled Nursing facility    Frequency and Duration (P)  (eval only)         SLP Evaluation Cognition  Overall Cognitive Status: No family/caregiver present to determine baseline cognitive functioning Arousal/Alertness: Awake/alert Orientation Level: Oriented to person Attention: (P) Sustained Sustained Attention: (P) Impaired Sustained Attention Impairment: (P) Functional basic Memory: (P) Impaired Memory Impairment: (P) Decreased short term memory Decreased Short Term Memory: (P) Verbal basic;Functional basic Awareness: (P) Impaired Awareness Impairment: (  P) Emergent impairment Problem Solving: (P) Impaired Problem  Solving Impairment: (P) Verbal basic;Functional basic (x subtraction, unable to problem solve cutting meat, using fork) Executive Function: (P)  (impaired due to lower level deficits) Behaviors: (P) Restless Safety/Judgment: (P) Impaired       Comprehension  Auditory Comprehension Overall Auditory Comprehension: (P) Appears within functional limits for tasks assessed Yes/No Questions: (P) Within Functional Limits Commands: (P) Within Functional Limits (simple 1 - 3 step) Conversation: (P) Simple Other Conversation Comments: (P) slow processing, needs extended time to respond Interfering Components: (P) Attention;Processing speed EffectiveTechniques: (P) Extra processing time;Repetition;Stressing words Visual Recognition/Discrimination Discrimination: (P) Not tested Reading Comprehension Reading Status: (P) Within funtional limits (for sentence level)    Expression Expression Primary Mode of Expression: (P) Verbal Verbal Expression Overall Verbal Expression: (P) Appears within functional limits for tasks assessed Initiation: (P) Impaired Repetition: (P) Impaired Level of Impairment: (P) Phrase level (working memory vs language) Naming: (P)  (named 2/2 common objects) Pragmatics: (P) Impairment (does not respond at times) Interfering Components: (P) Attention Non-Verbal Means of Communication: (P) Not applicable Written Expression Dominant Hand: (P) Left Written Expression: (P) Unable to assess (comment) (attempts to write sentence but illegible)   Oral / Motor  Motor Speech Overall Motor Speech: (P) Appears within functional limits for tasks assessed   Katherine, [employer], [place] 02/13/2021, 2:42 PM

## 2021-02-13 NOTE — H&P (Signed)
History and Physical    Melanie Simpson GXQ:[id number] DOB: 11-09-1923 DOA: 02/12/2021  PCP: Kirk Ruths, MD   Patient coming from: Home  I have personally briefly reviewed patient's old medical records in [place]  Chief Complaint: Difficulty ambulating, weakness left side face x2 days  HPI: Melanie Simpson is a 87 y.o. female with medical history significant for hypertension and dementia who was brought to the emergency room for evaluation of 2 days of difficulty ambulating as well as left-sided facial droop.  Most of the history is taken from son at bedside with whom patient has been living for the past 10 years.  He states patient has been declining and was previously ambulating with a cane and then a walker however for the past 2 days he noticed that she had difficulty coordinating her steps with the walker and then today noted a left-sided facial droop prompting the visit to the emergency room.  Patient is confused at baseline as a result of her dementia and she is unable to contribute to history ED Course: On arrival, BP 159/73 and vitals otherwise within normal limits.  Blood work mostly unremarkable.  COVID and flu negative EKG, personally viewed and interpreted NSR at 69 with no acute ST-T wave changes Imaging CT head without acute findings however MRI shows 1.5 cm acute ischemic right basal ganglia infarct  Patient treated with aspirin.  Hospitalist consulted for admission.   Review of Systems: Unable to obtain from patient due to dementia  Past Medical History:  Diagnosis Date   Allergy    Anemia    Arthritis    Cancer ([place])    SKIN CANCER   Dementia ([place])    GERD (gastroesophageal reflux disease)    Hypertension    H/O WAS ON LISINOPRIL-CURRENTLY ON NO MEDS    Past Surgical History:  Procedure Laterality Date   MASS EXCISION Right 04/05/2020   Procedure: EXCISION MASS;  Surgeon: Robert Bellow, MD;  Location: [facility] ORS;  Service: General;   Laterality: Right;  right leg   NO PAST SURGERIES       reports that she has never smoked. She has never used smokeless tobacco. She reports previous alcohol use. She reports that she does not use drugs.  No Known Allergies  Family History  Problem Relation Age of Onset   Heart disease Father    Cancer Other        breast   Diabetes Sister    Diabetes Son       Prior to Admission medications   Medication Sig Start Date End Date Taking? Authorizing Provider  acetaminophen (TYLENOL) 500 MG tablet Take 500 mg by mouth every 6 (six) hours as needed for moderate pain or headache.    [provider]  Cyanocobalamin (B-12 COMPLIANCE INJECTION IJ) Inject 1 Dose as directed every 30 (thirty) days.    [provider]  Cyanocobalamin (B-12 PO) Take 1 tablet by mouth daily.    [provider]  HYDROcodone-acetaminophen (NORCO/VICODIN) 5-325 MG tablet 1/2-1 tablet every 4-6 hours if needed for pain. 04/05/20   Robert Bellow, MD    Physical Exam: Vitals:   02/12/21 2131 02/12/21 2200 02/12/21 2338 02/13/21 0220  BP:  (!) 168/80 (!) 168/129 (!) 131/93  Pulse:  79 91 68  Resp:  18 18 16   Temp:      TempSrc:      SpO2: 95% 97% 95% 96%  Weight:      Height:  Vitals:   02/12/21 2131 02/12/21 2200 02/12/21 2338 02/13/21 0220  BP:  (!) 168/80 (!) 168/129 (!) 131/93  Pulse:  79 91 68  Resp:  18 18 16   Temp:      TempSrc:      SpO2: 95% 97% 95% 96%  Weight:      Height:          Constitutional: Alert and oriented x 1 . Not in any apparent distress HEENT:      Head: Left facial droop, atraumatic.         Eyes: PERLA, EOMI, Conjunctivae are normal. Sclera is non-icteric.       Mouth/Throat: Mucous membranes are moist.       Neck: Supple with no signs of meningismus. Cardiovascular: Regular rate and rhythm. No murmurs, gallops, or rubs. 2+ symmetrical distal pulses are present . No JVD.  2+ nonpitting LE edema Respiratory: Respiratory effort  normal .Lungs sounds clear bilaterally. No wheezes, crackles, or rhonchi.  Gastrointestinal: Soft, non tender, and non distended with positive bowel sounds.  Genitourinary: No CVA tenderness. Musculoskeletal: Nontender with normal range of motion in all extremities. No cyanosis, or erythema of extremities. Neurologic: Left facial droop. Moving all extremities.  Strength appears equal in all extremities.  Coordination not assessed as patient unable to follow instructions skin: Skin is warm, dry.  No rash or ulcers Psychiatric: Mood and affect are normal    Labs on Admission: I have personally reviewed following labs and imaging studies  CBC: Recent Labs  Lab 02/12/21 1722  WBC 3.8*  HGB 13.4  HCT 37.9  MCV 87.1  PLT 119   Basic Metabolic Panel: Recent Labs  Lab 02/12/21 1722  NA 134*  K 3.9  CL 105  CO2 23  GLUCOSE 105*  BUN 14  CREATININE 0.66  CALCIUM 8.9   GFR: Estimated Creatinine Clearance: 37.7 mL/min (by C-G formula based on SCr of 0.66 mg/dL). Liver Function Tests: No results for input(s): AST, ALT, ALKPHOS, BILITOT, PROT, ALBUMIN in the last 168 hours. No results for input(s): LIPASE, AMYLASE in the last 168 hours. No results for input(s): AMMONIA in the last 168 hours. Coagulation Profile: No results for input(s): INR, PROTIME in the last 168 hours. Cardiac Enzymes: No results for input(s): CKTOTAL, CKMB, CKMBINDEX, TROPONINI in the last 168 hours. BNP (last 3 results) No results for input(s): PROBNP in the last 8760 hours. HbA1C: No results for input(s): HGBA1C in the last 72 hours. CBG: Recent Labs  Lab 02/12/21 1716  GLUCAP 100*   Lipid Profile: No results for input(s): CHOL, HDL, LDLCALC, TRIG, CHOLHDL, LDLDIRECT in the last 72 hours. Thyroid Function Tests: No results for input(s): TSH, T4TOTAL, FREET4, T3FREE, THYROIDAB in the last 72 hours. Anemia Panel: No results for input(s): VITAMINB12, FOLATE, FERRITIN, TIBC, IRON, RETICCTPCT in the last  72 hours. Urine analysis: No results found for: COLORURINE, APPEARANCEUR, [place], [place], GLUCOSEU, HGBUR, BILIRUBINUR, KETONESUR, PROTEINUR, UROBILINOGEN, NITRITE, LEUKOCYTESUR  Radiological Exams on Admission: CT Head Wo Contrast  Result Date: 02/12/2021 CLINICAL DATA:  Left facial droop.  Dragging of the foot.  Headache. EXAM: CT HEAD WITHOUT CONTRAST TECHNIQUE: Contiguous axial images were obtained from the base of the skull through the vertex without intravenous contrast. COMPARISON:  MRI 05/30/2012 FINDINGS: Brain: Chronic atrophic change of the brain. No focal abnormality seen affecting brainstem or cerebellum. Cerebral hemispheres show chronic small-vessel ischemic changes throughout the white matter. The ventricles are prominent, but in proportion to the degree of atrophy and deep  white matter disease. Normal pressure hydrocephalus not completely excluded but not favored. No mass, hemorrhage, hydrocephalus or extra-axial collection. Vascular: There is atherosclerotic calcification of the major vessels at the base of the brain. Skull: Negative Sinuses/Orbits: Clear/normal Other: None IMPRESSION: No acute finding by CT. Atrophy and chronic small-vessel ischemic changes. Ventricles are prominent, felt consistent with the degree of brain atrophy and small vessel disease. Normal pressure hydrocephalus is not completely ruled out but not favored. Electronically Signed   By: Nelson Chimes M.D.   On: 02/12/2021 20:57   MR BRAIN WO CONTRAST  Result Date: 02/13/2021 CLINICAL DATA:  Initial evaluation for acute left facial weakness Mitzi Hansen. EXAM: MRI HEAD WITHOUT CONTRAST TECHNIQUE: Multiplanar, multiecho pulse sequences of the brain and surrounding structures were obtained without intravenous contrast. COMPARISON:  Prior CT from earlier the same day. FINDINGS: Brain: Examination technically limited as the patient was unable to tolerate the full length of the study. Additionally, provided images are somewhat  degraded by motion artifact. Moderately advanced cerebral atrophy, most pronounced at the temporal lobes bilaterally, worse on the left. Patchy T2 signal abnormality within the periventricular and deep white matter most consistent with chronic small vessel ischemic disease, moderate in nature. Approximate 1.5 cm curvilinear focus of restricted diffusion seen involving the right basal ganglia, consistent with an acute ischemic infarct (series 3, image 27). No obvious associated hemorrhage, although evaluation somewhat limited as no SWI sequence is available. No visible hemorrhage on prior CT. No mass effect. No other evidence for acute or subacute ischemia. Gray-white matter differentiation otherwise maintained. No encephalomalacia to suggest chronic cortical infarction. No mass lesion, midline shift or mass effect. Diffuse ventricular prominence related to global parenchymal volume loss without hydrocephalus. No extra-axial fluid collection. Pituitary gland suprasellar region grossly normal. Midline structures intact. Vascular: Major intracranial vascular flow voids are grossly maintained at the skull base. Skull and upper cervical spine: Craniocervical junction within normal limits. Bone marrow signal intensity normal. No scalp soft tissue abnormality. Sinuses/Orbits: Patient status post bilateral ocular lens replacement. Globes and orbital soft tissues demonstrate no acute finding. Paranasal sinuses are grossly clear. No significant mastoid effusion. Inner ear structures grossly normal. Other: None. IMPRESSION: 1. 1.5 cm acute ischemic right basal ganglia infarct. 2. Moderately advanced temporal lobe predominant cerebral atrophy with chronic small vessel ischemic disease. Electronically Signed   By: Jeannine Boga M.D.   On: 02/13/2021 00:24     Assessment/Plan 87 year old female with history of hypertension and presenting with 2 days of difficulty ambulating as well as left-sided facial droop.       Acute CVA (cerebrovascular accident) ([place]) -Patient with 2 days of difficulty ambulating with her walker and left-sided facial droop - MRI brain with 1.5 cm acute ischemic right basal ganglia infarct - Stroke work-up to include continuous cardiac monitoring, echocardiogram, carotid Doppler - Aspirin and statins - Allow permissive hypertension - Neurology consult - PT OT and speech therapy evaluation    Essential hypertension - Hold home antihypertensives to allow for permissive hypertension    Dementia without behavioral disturbance ([place]) - Stable.  Not on any    DVT prophylaxis: Lovenox  Code Status: DNR Family Communication: Spoke with son discussed plan of care and he is in agreement.  Also discussed CODE STATUS and states his mother is DNR Disposition Plan: Back to previous home environment Consults called: Neurology Status: Observation    Athena Masse MD [facility]     02/13/2021, 3:53 AM

## 2021-02-13 NOTE — Progress Notes (Signed)
*  PRELIMINARY RESULTS* Echocardiogram 2D Echocardiogram has been performed.  Melanie Simpson 02/13/2021, 11:00 AM

## 2021-02-13 NOTE — Progress Notes (Signed)
OT Cancellation Note  Patient Details Name: Melanie Simpson MRN: [id number] DOB: 04/07/1924   Cancelled Treatment:    Reason Eval/Treat Not Completed: Patient at procedure or test/ unavailable. Order received, chart reviewed. Pt out of room for testing. Will re-attempt OT evaluation at later date/time as pt is available and medically appropriate.  Hanley Hays, MPH, MS, OTR/L ascom [phone] 02/13/21, 11:34 AM

## 2021-02-13 NOTE — Progress Notes (Signed)
PT Cancellation Note  Patient Details Name: Melanie Simpson MRN: [id number] DOB: 11/18/1923   Cancelled Treatment:    Reason Eval/Treat Not Completed: Patient at procedure or test/unavailable. Pt off the floor at this time, PT to re-attempt as able.   Lieutenant Diego PT, DPT 11:30 AM,02/13/21

## 2021-02-13 NOTE — Plan of Care (Signed)

## 2021-02-13 NOTE — Progress Notes (Signed)
No charge note  Patient seen and examined this morning, admitted overnight by Dr. Damita Dunnings.  H&P reviewed and agree with the assessment and plan.  Pleasant 87 year old female with HTN, dementia came in with 2 days of difficulty ambulating as well as left facial droop.  Patient cannot contribute to the story this morning.  MRI did show CVA and was admitted to the hospital for further work-up, neurology consultation.  Solimar Maiden M. Cruzita Lederer, MD, PhD [facility]  Between 7 am - 7 pm you can contact me via Amion (for emergencies) or [place] (non urgent matters).  I am not available 7 pm - 7 am, please contact night coverage MD/APP via Amion

## 2021-02-13 NOTE — Evaluation (Signed)
Occupational Therapy Evaluation Patient Details Name: Melanie Simpson MRN: [id number] DOB: [birth year] Today's Date: 02/13/2021    History of Present Illness Pt is a 87 yo female that presented to the emergency department because of concerns for left facial droop and weakness. MRI showed "1.5 cm acute ischemic right basal ganglia infarct." PMH of dementia, GERD, HTN.   Clinical Impression   Pt seen for OT evaluation this date. Per pt/chart review, prior to hospital admission pt was recently using walker for ADL mobility. Pt lives with her son. Pt alert, pleasant, oriented to self, and able to follow 1 to 2 step commands with cues. Pt reports being L handed and feels as though her L arm isn't working normally. Currently pt demonstrates impairments in LUE/LLE strength and coordination (LUE>LLE), balance, and cognition. Pt requires mod/max assist for seated bathing and dressing tasks, PRN MIN A for grooming tasks. Pt is eager to return to her home. No visual deficits appreciated with assessment on this date. Pt would benefit from skilled OT to address noted impairments and functional limitations (see below for any additional details) in order to maximize safety and independence while minimizing falls risk and caregiver burden.  Upon hospital discharge, recommend pt discharge to short term rehab at SNF to maximize safety and return to PLOF.    Follow Up Recommendations  SNF    Equipment Recommendations  None recommended by OT (TBD at next venue of care)    Recommendations for Other Services       Precautions / Restrictions Precautions Precautions: Fall Restrictions Weight Bearing Restrictions: No      Mobility Bed Mobility      General bed mobility comments: NT, up in recliner    Transfers        General transfer comment: deferred    Balance                            ADL either performed or assessed with clinical judgement   ADL Overall ADL's : Needs  assistance/impaired                                       General ADL Comments: Pt currently requires PRN MIN A for grooming tasks, MIN-MOD A for UB ADL, MOD-MAX A LB ADL tasks 2/2 impaired strength, coordination, balance, and cognition     Vision Baseline Vision/History: No visual deficits Patient Visual Report: No change from baseline Vision Assessment?: No apparent visual deficits     Perception     Praxis      Pertinent Vitals/Pain Pain Assessment: No/denies pain Faces Pain Scale: No hurt     Hand Dominance Left   Extremity/Trunk Assessment Upper Extremity Assessment Upper Extremity Assessment: Generalized weakness;Difficult to assess due to impaired cognition;RUE deficits/detail;LUE deficits/detail RUE Deficits / Details: strength grossly 3+/5 to 4/5, [place] grossly WFL, denies sensory deficits RUE Coordination: WNL LUE Deficits / Details: strength grossly 3+/5 to 4/5, [place] impaired but functionally able to utilize to manipulate objects fairly well, impaired finger to nose, thumb opposition, denies sensory deficits LUE Coordination: decreased fine motor   Lower Extremity Assessment Lower Extremity Assessment: Generalized weakness;RLE deficits/detail;LLE deficits/detail RLE Deficits / Details: generally weak RLE Coordination: WNL LLE Deficits / Details: generally weak although appears more effortful, particularly ankle DF/PF LLE Sensation:  (unclear light touch sensation assessment results) LLE Coordination: WNL   Cervical /  Trunk Assessment Cervical / Trunk Assessment: Kyphotic   Communication Communication Communication: No difficulties   Cognition Arousal/Alertness: Awake/alert Behavior During Therapy: Flat affect;WFL for tasks assessed/performed Overall Cognitive Status: No family/caregiver present to determine baseline cognitive functioning                                 General Comments: Pt alert, oriented to self only, follows 1  and 2 step commands with cues   General Comments       Exercises    Shoulder Instructions      Home Living       Type of Home:  (pt unable to provide details)                           Additional Comments: Pt unreliable historian      Prior Functioning/Environment Level of Independence: Needs assistance        Comments: Per chart review pt lives with her son, and was ambulatory with SPC, more recently a walker        OT Problem List: Decreased strength;Decreased coordination;Decreased cognition;Impaired balance (sitting and/or standing);Decreased knowledge of use of DME or AE;Impaired UE functional use      OT Treatment/Interventions: Self-care/ADL training;Therapeutic exercise;Neuromuscular education;Therapeutic activities;DME and/or AE instruction;Patient/family education;Balance training;Cognitive remediation/compensation    OT Goals(Current goals can be found in the care plan section) Acute Rehab OT Goals Patient Stated Goal: go home OT Goal Formulation: With patient Time For Goal Achievement: 02/27/21 Potential to Achieve Goals: Good ADL Goals Pt Will Perform Upper Body Dressing: sitting;with supervision;with set-up Pt Will Perform Lower Body Dressing: sit to/from stand;with min assist;with mod assist Pt Will Transfer to Toilet: with min assist;ambulating;bedside commode (LRAD for amb) Additional ADL Goal #1: Pt will perform seated UE ther ex program with handout provided + VC for sequencing to improve LUE strength and coordination for ADL/IADL.  OT Frequency: Min 2X/week   Barriers to D/C:            Co-evaluation              AM-PAC OT "6 Clicks" Daily Activity     Outcome Measure Help from another person eating meals?: A Little Help from another person taking care of personal grooming?: A Little Help from another person toileting, which includes using toliet, bedpan, or urinal?: A Lot Help from another person bathing (including  washing, rinsing, drying)?: A Lot Help from another person to put on and taking off regular upper body clothing?: A Little Help from another person to put on and taking off regular lower body clothing?: A Lot 6 Click Score: 15   End of Session    Activity Tolerance: Patient tolerated treatment well Patient left: in chair;with call bell/phone within reach;with chair alarm set;with family/visitor present  OT Visit Diagnosis: Other abnormalities of gait and mobility (R26.89);Hemiplegia and hemiparesis;Muscle weakness (generalized) (M62.81) Hemiplegia - Right/Left: Left Hemiplegia - dominant/non-dominant: Dominant Hemiplegia - caused by: Cerebral infarction                Time: 6503-5465 OT Time Calculation (min): 12 min Charges:  OT General Charges $OT Visit: 1 Visit OT Evaluation $OT Eval Moderate Complexity: 1 Mod  Hanley Hays, MPH, MS, OTR/L ascom [phone] 02/13/21, 3:55 PM

## 2021-02-13 NOTE — ED Notes (Signed)
Pt plan to admit; consulted to admitting doctor; pt was put on bed alarm; covid swabbed; and was given 324 aspirin during system downtime.  Pt was put on a purewick at this time.

## 2021-02-13 NOTE — Consult Note (Signed)
Neurology Consultation Reason for Consult: Stroke Referring Physician: Cruzita Lederer, C  CC: Left Sided Weakness  History is obtained from: Patient, daughter-in-law  HPI: Melanie Simpson is a 87 y.o. female who has been having increasing difficulty walking and left-sided facial droop for 2 days.  She has been having progressive difficulty with walking over the past few years, but there was a sudden change over the past few days and she has been having trouble getting around as she typically does.  They also noticed some facial droop and therefore she presented to the emergency department for evaluation and was admitted for suspicion of stroke.  Since then, an MRI was obtained which demonstrates a right basal ganglia stroke.   LKW: 2 days ago tpa given?: no, outside window     ROS: A 14 point ROS was performed and is negative except as noted in the HPI.  Past Medical History:  Diagnosis Date   Allergy    Anemia    Arthritis    Cancer ([place])    SKIN CANCER   Dementia (HCC)    GERD (gastroesophageal reflux disease)    Hypertension    H/O WAS ON LISINOPRIL-CURRENTLY ON NO MEDS     Family History  Problem Relation Age of Onset   Heart disease Father    Cancer Other        breast   Diabetes Sister    Diabetes Son      Social History:  reports that she has never smoked. She has never used smokeless tobacco. She reports previous alcohol use. She reports that she does not use drugs.   Exam: Current vital signs: BP (!) 135/58 (BP Location: Left Arm)   Pulse 76   Temp 97.7 F (36.5 C) (Oral)   Resp (!) 9   Ht 5\' 3"  (1.6 m)   Wt 65.7 kg   SpO2 97%   BMI 25.66 kg/m  Vital signs in last 24 hours: Temp:  [97.7 F (36.5 C)-98.6 F (37 C)] 97.7 F (36.5 C) (06/09 1230) Pulse Rate:  [68-91] 76 (06/09 1230) Resp:  [9-18] 9 (06/09 1230) BP: (128-191)/(58-129) 135/58 (06/09 1230) SpO2:  [95 %-99 %] 97 % (06/09 1230) Weight:  [63.5 kg-65.7 kg] 65.7 kg (06/09  0600)   Physical Exam  Constitutional: Appears well-developed and well-nourished.  Psych: Affect appropriate to situation Eyes: No scleral injection HENT: No OP obstruction MSK: no joint deformities.  Cardiovascular: Normal rate and regular rhythm.  Respiratory: Effort normal, non-labored breathing GI: Soft.  No distension. There is no tenderness.  Skin: WDI  Neuro: Mental Status: Patient is awake, alert, oriented to person, place, she gives the month as December and will not guess the year Cranial Nerves: II: Visual Fields are full. Pupils are equal, round, and reactive to light.   III,IV, VI: EOMI without ptosis or diploplia.  V: Facial sensation is symmetric to temperature VII: Facial movement with left facial droop VIII: hearing is intact to voice X: Uvula elevates symmetrically XI: Shoulder shrug is symmetric. XII: tongue is midline without atrophy or fasciculations.  Motor: Tone is normal. Bulk is normal. 5/5 strength was present in the right arm, she gives 4 -/5 weakness of the right leg and 3/5 in the left proximally, distally the right is 5/5 on the left is 4/5. Sensory: Sensation is symmetric to light touch and temperature in the arms and legs.Cerebellar: No clear ataxia      I have reviewed labs in epic and the results pertinent to this  consultation are: LDL 199  I have reviewed the images obtained:   Impression: 87 year old female with likely small vessel disease related to ischemic infarct.  She will need therapies for rehabilitation.  As far as secondary prevention, I would favor dual antiplatelet therapy for 3 weeks followed by aspirin monotherapy.  Though she is quite elderly, there is some benefit to statin therapy for acute plaque stabilization and therefore I think she may get some benefit even at her age.  Given her age, however, I would favor starting also do low-dose advancing slowly as tolerated  Recommendations: 1) aspirin 81 mg daily and Plavix 75  mg daily following 300 mg load for 3 weeks followed by aspirin monotherapy 2) atorvastatin 10 mg nightly 3) PT,OT,ST   Roland Rack, MD [facility] [phone]  If 7pm- 7am, please page neurology on call as listed in [place].

## 2021-02-14 DIAGNOSIS — F0280 Dementia in other diseases classified elsewhere without behavioral disturbance: Secondary | ICD-10-CM

## 2021-02-14 LAB — HEMOGLOBIN A1C
Hgb A1c MFr Bld: 5.4 % (ref 4.8–5.6)
Mean Plasma Glucose: 108 mg/dL

## 2021-02-14 NOTE — Clinical Social Work Note (Cosign Needed)
    RE:  Melanie Simpson   Date of Birth:   05-Feb-1925___________  Date:  02/14/2021      To Whom It May Concern:  Please be advised that the above-named patient will require a short-term nursing home stay - anticipated 30 days or less for rehabilitation and strengthening.  The plan is for return home.   SEE ELECTRONIC SIGNATURE BELOW     MD signature   06/10/2022_  _________ Date

## 2021-02-14 NOTE — NC FL2 (Signed)
[place] LEVEL OF CARE SCREENING TOOL     IDENTIFICATION  Patient Name: Melanie Simpson Birthdate: 05-04-1924 Sex: female Admission Date (Current Location): 02/12/2021  [facility] and [place] Number:  Engineering geologist and Address:  [facility], [address], [place], [place] [address]      Provider Number: [id number]  Attending Physician Name and Address:  Caren Griffins, MD  Relative Name and Phone Number:       Current Level of Care: Hospital Recommended Level of Care: [place] Prior Approval Number:    Date Approved/Denied:   PASRR Number:    Discharge Plan: SNF    Current Diagnoses: Patient Active Problem List   Diagnosis Date Noted   Acute CVA (cerebrovascular accident) ([place]) 02/13/2021   Dementia without behavioral disturbance ([place]) 02/13/2021   Stroke ([place]) 02/13/2021   Venous insufficiency 02/28/2020   Thrombocytopenia ([place]) 06/24/2017   GERD (gastroesophageal reflux disease) 03/11/2017   Senile osteoporosis 02/28/2015   Hypothyroid 02/28/2015   Hyperlipemia 02/28/2015   Stricture and stenosis of esophagus 02/28/2015   Nocturnal and diurnal enuresis 02/28/2015   Seasonal allergic rhinitis 02/28/2015   Allergic asthma 02/28/2015   Arthritis 02/28/2015   Varicella 02/28/2015   Essential hypertension 02/28/2015   Gravida 3 para 3 02/28/2015   Absence of bladder continence 02/28/2015   Allergic rhinitis, seasonal 02/28/2015    Orientation RESPIRATION BLADDER Height & Weight     Self  Normal Incontinent Weight: 144 lb 13.5 oz (65.7 kg) Height:  5\' 3"  (160 cm)  BEHAVIORAL SYMPTOMS/MOOD NEUROLOGICAL BOWEL NUTRITION STATUS      Continent Diet (heart healthy, thin liquids)  AMBULATORY STATUS COMMUNICATION OF NEEDS Skin   Extensive Assist Verbally Normal                       Personal Care Assistance Level of Assistance  Bathing, Feeding, Dressing Bathing Assistance: Maximum  assistance Feeding assistance: Independent Dressing Assistance: Maximum assistance     Functional Limitations Info  Sight, Hearing, Speech Sight Info: Adequate Hearing Info: Adequate Speech Info: Adequate    SPECIAL CARE FACTORS FREQUENCY  PT (By licensed PT), OT (By licensed OT)     PT Frequency: 5x OT Frequency: 5x            Contractures Contractures Info: Not present    Additional Factors Info  Code Status, Allergies Code Status Info: DNR Allergies Info: no known allergies           Current Medications (02/14/2021):  This is the current hospital active medication list Current Facility-Administered Medications  Medication Dose Route Frequency Provider Last Rate Last Admin   0.9 %  sodium chloride infusion   Intravenous Continuous Athena Masse, MD 50 mL/hr at 02/13/21 0428 New Bag at 02/13/21 0428   acetaminophen (TYLENOL) tablet 650 mg  650 mg Oral Q4H PRN Athena Masse, MD       Or   acetaminophen (TYLENOL) 160 MG/5ML solution 650 mg  650 mg Per Tube Q4H PRN Athena Masse, MD       Or   acetaminophen (TYLENOL) suppository 650 mg  650 mg Rectal Q4H PRN Athena Masse, MD       aspirin EC tablet 81 mg  81 mg Oral Daily Greta Doom, MD   81 mg at 02/14/21 0905   atorvastatin (LIPITOR) tablet 10 mg  10 mg Oral QHS Caren Griffins, MD   10 mg at  02/13/21 1857   clopidogrel (PLAVIX) tablet 75 mg  75 mg Oral Daily Greta Doom, MD   75 mg at 02/14/21 0905   enoxaparin (LOVENOX) injection 40 mg  40 mg Subcutaneous Q24H Athena Masse, MD   40 mg at 02/14/21 1100     Discharge Medications: Please see discharge summary for a list of discharge medications.  Relevant Imaging Results:  Relevant Lab Results:   Additional Information SSN:[id number]  Gerrianne Scale Emmanuelle Coxe, LCSW

## 2021-02-14 NOTE — Progress Notes (Signed)
Physical Therapy Treatment Patient Details Name: Melanie Simpson MRN: [id number] DOB: [birth year] Today's Date: 02/14/2021    History of Present Illness Pt is a 87 yo female that presented to the emergency department because of concerns for left facial droop and weakness. MRI showed "1.5 cm acute ischemic right basal ganglia infarct." PMH of dementia, GERD, HTN.    PT Comments    Patient easily wakes to touch and voice. When prompted did report pain, 2/10 on the FACES scale. The patient demonstrated good improvement towards goals this session, but still exhibited limitations in function and mobility. Supine to sit with minA and use of bed rails. Sit <> stand with RW and minA, performed twice. Pt with posterior lean, but less than last session. She was able to ambulate ~26ft with RW and minA, further mobility declined to pt reported being cold, and sitting down. Pt up in chair with all needs in reach. The patient would benefit from further skilled PT intervention to continue to progress towards goals. Recommendation remains appropriate.     Follow Up Recommendations  SNF     Equipment Recommendations  Other (comment) (TBD at next venue of care)    Recommendations for Other Services       Precautions / Restrictions Precautions Precautions: Fall Restrictions Weight Bearing Restrictions: No    Mobility  Bed Mobility Overal bed mobility: Needs Assistance Bed Mobility: Supine to Sit     Supine to sit: Min assist;HOB elevated          Transfers Overall transfer level: Needs assistance Equipment used: Rolling walker (2 wheeled) Transfers: Sit to/from [facility] Sit to Stand: Min assist Stand pivot transfers: Min assist          Ambulation/Gait Ambulation/Gait assistance: Min assist Gait Distance (Feet): 5 Feet Assistive device: Rolling walker (2 wheeled)   Gait velocity: decreased   General Gait Details: pt able to walk a total of 33ft in session.  further ambulation deferred, pt declined and exclaimed "I am cold" and sat down. minA for safety due to posterior lean noted, RW assist   Stairs             Wheelchair Mobility    Modified Rankin (Stroke Patients Only)       Balance Overall balance assessment: Needs assistance Sitting-balance support: Feet supported Sitting balance-Leahy Scale: Fair     Standing balance support: Bilateral upper extremity supported Standing balance-Leahy Scale: Poor Standing balance comment: reliant on UE support                            Cognition Arousal/Alertness: Awake/alert Behavior During Therapy: Flat affect;WFL for tasks assessed/performed Overall Cognitive Status: No family/caregiver present to determine baseline cognitive functioning                                 General Comments: Pt alert, oriented to self only, follows 1 and 2 step commands with cues      Exercises      General Comments        Pertinent Vitals/Pain Pain Assessment: Faces Faces Pain Scale: Hurts a little bit Pain Location: pt unable to describe location, but when prompted agreed to generalized pain Pain Descriptors / Indicators: Grimacing;Moaning Pain Intervention(s): Limited activity within patient's tolerance;Monitored during session;Repositioned    Home Living  Prior Function            PT Goals (current goals can now be found in the care plan section) Progress towards PT goals: Progressing toward goals    Frequency    7X/week      PT Plan Current plan remains appropriate    Co-evaluation              AM-PAC PT "6 Clicks" Mobility   Outcome Measure  Help needed turning from your back to your side while in a flat bed without using bedrails?: A Little Help needed moving from lying on your back to sitting on the side of a flat bed without using bedrails?: A Lot Help needed moving to and from a bed to a chair  (including a wheelchair)?: A Lot Help needed standing up from a chair using your arms (e.g., wheelchair or bedside chair)?: A Lot Help needed to walk in hospital room?: Total Help needed climbing 3-5 steps with a railing? : Total 6 Click Score: 11    End of Session Equipment Utilized During Treatment: Gait belt Activity Tolerance: Patient tolerated treatment well Patient left: in chair;with chair alarm set;with call bell/phone within reach Nurse Communication: Mobility status PT Visit Diagnosis: Other abnormalities of gait and mobility (R26.89);Difficulty in walking, not elsewhere classified (R26.2);Muscle weakness (generalized) (M62.81);Unsteadiness on feet (R26.81)     Time: 5102-5852 PT Time Calculation (min) (ACUTE ONLY): 17 min  Charges:  $Therapeutic Activity: 8-22 mins                     Lieutenant Diego PT, DPT 2:57 PM,02/14/21

## 2021-02-14 NOTE — Progress Notes (Signed)
PROGRESS NOTE  Corda Shutt NUU:[id number] DOB: 1923/12/18 DOA: 02/12/2021 PCP: Kirk Ruths, MD   LOS: 1 day   Brief Narrative / Interim history: 87 year old female with HTN, dementia, comes into the hospital brought by her son due to couple of days of difficulty ambulating as well as left-sided facial droop.  Patient has been living with her son for the past 10 years, and he feels like this was a rather acute change but did have some progressive weakness for quite some time.  She was found to have a stroke and was admitted to the hospital.  Subjective / 24h Interval events: Underlying dementia, no significant complaints  Assessment & Plan: Principal Problem Acute CVA-MRI on admission showed 1.5 cm acute ischemic right basal ganglia infarct.  Neurology consulted and followed patient while hospitalized.  She underwent a stroke work-up with carotid Doppler without hemodynamically significant stenosis, 2D echo showed EF 60-65%, normal RV.  Lipid panel showed an LDL of 199, she was placed on a statin.  A1c was 5.4.  Neurology recommends aspirin and Plavix for 3 weeks followed by aspirin alone.  PT recommending SNF, placement pending  Active Problems Essential hypertension-allow permissive hypertension, gradually normalized 3 to 5 days  Dementia without behavioral disturbances-stable  Scheduled Meds:  aspirin EC  81 mg Oral Daily   atorvastatin  10 mg Oral QHS   clopidogrel  75 mg Oral Daily   enoxaparin (LOVENOX) injection  40 mg Subcutaneous Q24H   Continuous Infusions:  sodium chloride 50 mL/hr at 02/13/21 0428   PRN Meds:.acetaminophen **OR** acetaminophen (TYLENOL) oral liquid 160 mg/5 mL **OR** acetaminophen  Diet Orders (From admission, onward)     Start     Ordered   02/13/21 1453  Diet Heart Room service appropriate? Yes; Fluid consistency: Thin  Diet effective now       Comments: Chop meats, add gravy  Question Answer Comment  Room service appropriate? Yes    Fluid consistency: Thin      02/13/21 1452            DVT prophylaxis: Place TED hose Start: 02/14/21 1003 enoxaparin (LOVENOX) injection 40 mg Start: 02/13/21 0800     Code Status: DNR  Family Communication: Discussed with son over the phone  Status is: Inpatient  Remains inpatient appropriate because:Inpatient level of care appropriate due to severity of illness  Dispo: The patient is from: Home              Anticipated d/c is to: SNF              Patient currently is medically stable to d/c.   Difficult to place patient No   Level of care: Med-Surg  Consultants:  Neurology  Procedures:  2D echo Carotid Doppler  Microbiology  none  Antimicrobials: none    Objective: Vitals:   02/13/21 1610 02/13/21 2013 02/14/21 0527 02/14/21 0812  BP: (!) 162/71   108/65  Pulse: 77   69  Resp: 13   12  Temp: 97.8 F (36.6 C) 97.8 F (36.6 C) 97.6 F (36.4 C) (!) 97.4 F (36.3 C)  TempSrc: Oral Oral Oral Oral  SpO2: 98% 98% 98% 100%  Weight:      Height:        Intake/Output Summary (Last 24 hours) at 02/14/2021 1123 Last data filed at 02/14/2021 1114 Gross per 24 hour  Intake 480 ml  Output 1150 ml  Net -670 ml   Filed Weights   02/12/21 1718 02/13/21  0600  Weight: 63.5 kg 65.7 kg    Examination: Constitutional: NAD Eyes: no scleral icterus ENMT: Mucous membranes are moist.  Neck: normal, supple Respiratory: clear to auscultation bilaterally, no wheezing, no crackles. Normal respiratory effort. No accessory muscle use.  Cardiovascular: Regular rate and rhythm, no murmurs Abdomen: non distended, no tenderness. Bowel sounds positive.  Musculoskeletal: no clubbing / cyanosis.  Skin: no rashes Neurologic: Inconsistent participation with exam but appears weaker on left  Data Reviewed: I have independently reviewed following labs and imaging studies  CBC: Recent Labs  Lab 02/12/21 1722 02/13/21 0415  WBC 3.8* 5.1  HGB 13.4 14.0  HCT 37.9 40.1   MCV 87.1 88.7  PLT 160 301   Basic Metabolic Panel: Recent Labs  Lab 02/12/21 1722 02/13/21 0415  NA 134*  --   K 3.9  --   CL 105  --   CO2 23  --   GLUCOSE 105*  --   BUN 14  --   CREATININE 0.66 0.72  CALCIUM 8.9  --    Liver Function Tests: No results for input(s): AST, ALT, ALKPHOS, BILITOT, PROT, ALBUMIN in the last 168 hours. Coagulation Profile: No results for input(s): INR, PROTIME in the last 168 hours. HbA1C: Recent Labs    02/13/21 0415  HGBA1C 5.4   CBG: Recent Labs  Lab 02/12/21 1716  GLUCAP 100*    Recent Results (from the past 240 hour(s))  Resp Panel by RT-PCR (Flu A&B, Covid)     Status: None   Collection Time: 02/13/21  1:55 AM  Result Value Ref Range Status   SARS Coronavirus 2 by RT PCR NEGATIVE NEGATIVE Final    Comment: (NOTE) SARS-CoV-2 target nucleic acids are NOT DETECTED.  The SARS-CoV-2 RNA is generally detectable in upper respiratory specimens during the acute phase of infection. The lowest concentration of SARS-CoV-2 viral copies this assay can detect is 138 copies/mL. A negative result does not preclude SARS-Cov-2 infection and should not be used as the sole basis for treatment or other patient management decisions. A negative result may occur with  improper specimen collection/handling, submission of specimen other than nasopharyngeal swab, presence of viral mutation(s) within the areas targeted by this assay, and inadequate number of viral copies(<138 copies/mL). A negative result must be combined with clinical observations, patient history, and epidemiological information. The expected result is Negative.  Fact Sheet for Patients:  EntrepreneurPulse.com.au  Fact Sheet for Healthcare Providers:  IncredibleEmployment.be  This test is no t yet approved or cleared by the [place] FDA and  has been authorized for detection and/or diagnosis of SARS-CoV-2 by FDA under an Emergency Use  Authorization (EUA). This EUA will remain  in effect (meaning this test can be used) for the duration of the COVID-19 declaration under Section 564(b)(1) of the Act, 21 U.S.C.section 360bbb-3(b)(1), unless the authorization is terminated  or revoked sooner.       Influenza A by PCR NEGATIVE NEGATIVE Final   Influenza B by PCR NEGATIVE NEGATIVE Final    Comment: (NOTE) The Xpert Xpress SARS-CoV-2/FLU/RSV plus assay is intended as an aid in the diagnosis of influenza from Nasopharyngeal swab specimens and should not be used as a sole basis for treatment. Nasal washings and aspirates are unacceptable for Xpert Xpress SARS-CoV-2/FLU/RSV testing.  Fact Sheet for Patients: EntrepreneurPulse.com.au  Fact Sheet for Healthcare Providers: IncredibleEmployment.be  This test is not yet approved or cleared by the [place] FDA and has been authorized for detection and/or diagnosis of SARS-CoV-2  by FDA under an Emergency Use Authorization (EUA). This EUA will remain in effect (meaning this test can be used) for the duration of the COVID-19 declaration under Section 564(b)(1) of the Act, 21 U.S.C. section 360bbb-3(b)(1), unless the authorization is terminated or revoked.  Performed at [facility], [address] [place], [place] [address]      Radiology Studies: US Carotid Bilateral (at [facility] and [facility] only)  Result Date: 02/13/2021 CLINICAL DATA:  87 year old female with a history of stroke EXAM: BILATERAL CAROTID DUPLEX ULTRASOUND TECHNIQUE: Pearline Cables scale imaging, color Doppler and duplex ultrasound were performed of bilateral carotid and vertebral arteries in the neck. COMPARISON:  None. FINDINGS: Criteria: Quantification of carotid stenosis is based on velocity parameters that correlate the residual internal carotid diameter with NASCET-based stenosis levels, using the diameter of the distal internal carotid lumen as the denominator for stenosis  measurement. The following velocity measurements were obtained: RIGHT ICA:  Systolic 44 cm/sec, Diastolic 10 cm/sec CCA:  41 cm/sec SYSTOLIC ICA/CCA RATIO:  1.1 ECA:  71 cm/sec LEFT ICA:  Systolic 67 cm/sec, Diastolic 20 cm/sec CCA:  56 cm/sec SYSTOLIC ICA/CCA RATIO:  1.2 ECA:  47 cm/sec Right Brachial SBP: Not acquired Left Brachial SBP: Not acquired RIGHT CAROTID ARTERY: No significant calcifications of the right common carotid artery. Intermediate waveform maintained. Heterogeneous and partially calcified plaque at the right carotid bifurcation. No significant lumen shadowing. Low resistance waveform of the right ICA. No significant tortuosity. RIGHT VERTEBRAL ARTERY: Antegrade flow with low resistance waveform. LEFT CAROTID ARTERY: No significant calcifications of the left common carotid artery. Intermediate waveform maintained. Heterogeneous and partially calcified plaque at the left carotid bifurcation without significant lumen shadowing. Low resistance waveform of the left ICA. No significant tortuosity. LEFT VERTEBRAL ARTERY:  Antegrade flow with low resistance waveform. IMPRESSION: Color duplex indicates minimal heterogeneous and calcified plaque, with no hemodynamically significant stenosis by duplex criteria in the extracranial cerebrovascular circulation. Signed, Dulcy Fanny. Dellia Nims, RPVI [facility] Electronically Signed   By: Corrie Mckusick D.O.   On: 02/13/2021 11:51     Marzetta Board, MD, PhD [facility]  Between 7 am - 7 pm I am available, please contact me via Amion (for emergencies) or Securechat (non urgent messages)  Between 7 pm - 7 am I am not available, please contact night coverage MD/APP via Amion

## 2021-02-14 NOTE — TOC Initial Note (Signed)
Transition of Care [facility]) - Initial/Assessment Note    Patient Details  Name: Melanie Simpson MRN: [id number] Date of Birth: [birth year]  [facility]) CM/SW Contact:    Eileen Stanford, LCSW Phone Number: 02/14/2021, 9:10 AM  Clinical Narrative:   Pt is only alert to self. CSW spoke with pt's son. Pt's son states he is agreeable for pt to go to SNF. Pt's son state they would prefer [facility]. CSW will send referral. PASRR pending. Will need prior auth and Covid test before dc.                Expected Discharge Plan: Skilled Nursing Facility Barriers to Discharge: Continued Medical Work up   Patient Goals and CMS Choice Patient states their goals for this hospitalization and ongoing recovery are:: for pt to go to snf   Choice offered to / list presented to : Adult Children  Expected Discharge Plan and Services Expected Discharge Plan: [place] In-house Referral: NA   Post Acute Care Choice: [place] Living arrangements for the past 2 months: Single Family Home                                      Prior Living Arrangements/Services Living arrangements for the past 2 months: Single Family Home Lives with:: Adult Children Patient language and need for interpreter reviewed:: Yes Do you feel safe going back to the place where you live?: Yes      Need for Family Participation in Patient Care: Yes (Comment) Care giver support system in place?: Yes (comment)   Criminal Activity/Legal Involvement Pertinent to Current Situation/Hospitalization: No - Comment as needed  Activities of Daily Living      Permission Sought/Granted Permission sought to share information with : Family Supports Permission granted to share information with : Yes, Release of Information Signed  Share Information with NAME: Melanie Simpson  Permission granted to share info w [facility]ENC[facility]: [facility]  Permission granted to share info w Relationship: son     Emotional  Assessment Appearance:: Appears stated age Attitude/Demeanor/Rapport: Unable to Assess Affect (typically observed): Unable to Assess Orientation: : Oriented to Self Alcohol / Substance Use: Not Applicable Psych Involvement: No (comment)  Admission diagnosis:  Stroke (cerebrum) ([place]) [I63.9] Acute CVA (cerebrovascular accident) ([place]) [I63.9] Cerebrovascular accident (CVA), unspecified mechanism ([place]) [I63.9] Stroke [facility]) [I63.9] Patient Active Problem List   Diagnosis Date Noted   Acute CVA (cerebrovascular accident) ([place]) 02/13/2021   Dementia without behavioral disturbance ([place]) 02/13/2021   Stroke ([place]) 02/13/2021   Venous insufficiency 02/28/2020   Thrombocytopenia ([place]) 06/24/2017   GERD (gastroesophageal reflux disease) 03/11/2017   Senile osteoporosis 02/28/2015   Hypothyroid 02/28/2015   Hyperlipemia 02/28/2015   Stricture and stenosis of esophagus 02/28/2015   Nocturnal and diurnal enuresis 02/28/2015   Seasonal allergic rhinitis 02/28/2015   Allergic asthma 02/28/2015   Arthritis 02/28/2015   Varicella 02/28/2015   Essential hypertension 02/28/2015   Gravida 3 para 3 02/28/2015   Absence of bladder continence 02/28/2015   Allergic rhinitis, seasonal 02/28/2015   PCP:  Kirk Ruths, MD Pharmacy:   [place], [place] [address] Phone: [phone] Fax: [place], [place]  [address] Phone: [phone] Fax: [phone]  [id number] [place], [place] [address] [place] [address] Phone: [phone] Fax: [phone]     Social Determinants of Health (SDOH) Interventions    Readmission Risk Interventions No flowsheet data found.

## 2021-02-15 NOTE — Progress Notes (Signed)
Physical Therapy Treatment Patient Details Name: Melanie Simpson MRN: [id number] DOB: 1923/10/12 Today's Date: 02/15/2021    History of Present Illness Pt is a 87 yo female that presented to the emergency department because of concerns for left facial droop and weakness. MRI showed "1.5 cm acute ischemic right basal ganglia infarct." PMH of dementia, GERD, HTN.    PT Comments    Pt was long sitting in bed with barely touch lunch tray in front of her. She is Alert but disoriented and pleasantly confused. She is retired Therapist, sports. Agreeable to OOB activity with encouragement. Pt is very incontinent. Required briefs x 2 during session. Pt was able to exit bed, stand, and ambulate ~ 15 ft with RW + constant assistance. Did have successful urination on [facility] however during ambulation has another episode of urination. She overall tolerated session well. Will need extensive PT going forward to progress to PLOF.    Follow Up Recommendations  SNF     Equipment Recommendations  Other (comment) (Defer to next level of care)       Precautions / Restrictions Precautions Precautions: Fall Restrictions Weight Bearing Restrictions: No    Mobility  Bed Mobility Overal bed mobility: Needs Assistance Bed Mobility: Supine to Sit     Supine to sit: Min assist;HOB elevated     General bed mobility comments: Increased time to perform however pt able to exit bed with min assist + alot of vcs    Transfers Overall transfer level: Needs assistance Equipment used: Rolling walker (2 wheeled) (youth) Transfers: Sit to/from Stand Sit to Stand: Min assist         General transfer comment: Pt was able to STS from EOB to youth RW with min A + vcs. pt does impulsively let go of RW with posterior LOB noted several times throughout session. Pt performed STS 4 x. 2 x EOB and 2 x from [facility]  Ambulation/Gait Ambulation/Gait assistance: Min assist Gait Distance (Feet): 15 Feet Assistive device: Rolling walker (2  wheeled)   Gait velocity: decreased   General Gait Details: Pt was able to ambulate 15 ft with constant min assist + vcs for progression. Pt has poor insight of deficits and poor safety awareness. Distance limited by several incontinence of urine episodes.       Balance Overall balance assessment: Needs assistance Sitting-balance support: Feet supported Sitting balance-Leahy Scale: Good Sitting balance - Comments: no LOB seated EOB or on BSC   Standing balance support: Bilateral upper extremity supported Standing balance-Leahy Scale: Poor Standing balance comment: posterior LOB frequently      Cognition Arousal/Alertness: Awake/alert Behavior During Therapy: WFL for tasks assessed/performed Overall Cognitive Status: No family/caregiver present to determine baseline cognitive functioning        General Comments: Pt is alert but disoriented/ pleasantly confused. attempted to see in morning however pt refused. did agree in afternoon and seemed more alert. Pt is VERY incontinent             Pertinent Vitals/Pain Pain Assessment: No/denies pain Faces Pain Scale: No hurt     PT Goals (current goals can now be found in the care plan section) Acute Rehab PT Goals Patient Stated Goal: go home Progress towards PT goals: Progressing toward goals    Frequency    7X/week      PT Plan Current plan remains appropriate       AM-PAC PT "6 Clicks" Mobility   Outcome Measure  Help needed turning from your back to your side while  in a flat bed without using bedrails?: A Little Help needed moving from lying on your back to sitting on the side of a flat bed without using bedrails?: A Lot Help needed moving to and from a bed to a chair (including a wheelchair)?: A Lot Help needed standing up from a chair using your arms (e.g., wheelchair or bedside chair)?: A Lot Help needed to walk in hospital room?: A Lot Help needed climbing 3-5 steps with a railing? : A Lot 6 Click Score:  13    End of Session Equipment Utilized During Treatment: Gait belt Activity Tolerance: Patient tolerated treatment well Patient left: in bed;with call bell/phone within reach;with bed alarm set;with nursing/sitter in room Nurse Communication: Mobility status PT Visit Diagnosis: Other abnormalities of gait and mobility (R26.89);Difficulty in walking, not elsewhere classified (R26.2);Muscle weakness (generalized) (M62.81);Unsteadiness on feet (R26.81)     Time: 1414-1440 PT Time Calculation (min) (ACUTE ONLY): 26 min  Charges:  $Therapeutic Activity: 23-37 mins                    Julaine Fusi PTA 02/15/21, 4:14 PM

## 2021-02-15 NOTE — Progress Notes (Signed)
PROGRESS NOTE  Melanie Simpson PQZ:[id number] DOB: [birth year] DOA: 02/12/2021 PCP: Kirk Ruths, MD   LOS: 2 days   Brief Narrative / Interim history: 87 year old female with HTN, dementia, comes into the hospital brought by her son due to couple of days of difficulty ambulating as well as left-sided facial droop.  Patient has been living with her son for the past 10 years, and he feels like this was a rather acute change but did have some progressive weakness for quite some time.  She was found to have a stroke and was admitted to the hospital.  Subjective / 24h Interval events: No complaints this morning  Assessment & Plan: Principal Problem Acute CVA-MRI on admission showed 1.5 cm acute ischemic right basal ganglia infarct.  Neurology consulted and followed patient while hospitalized.  She underwent a stroke work-up with carotid Doppler without hemodynamically significant stenosis, 2D echo showed EF 60-65%, normal RV.  Lipid panel showed an LDL of 199, she was placed on a statin.  A1c was 5.4.  Neurology recommends aspirin and Plavix for 3 weeks followed by aspirin alone.  PT recommending SNF, placement pending  Active Problems Essential hypertension-allow permissive hypertension, gradually normalized 3 to 5 days  Dementia without behavioral disturbances-stable  Scheduled Meds:  aspirin EC  81 mg Oral Daily   atorvastatin  10 mg Oral QHS   clopidogrel  75 mg Oral Daily   enoxaparin (LOVENOX) injection  40 mg Subcutaneous Q24H   Continuous Infusions:   PRN Meds:.acetaminophen **OR** acetaminophen (TYLENOL) oral liquid 160 mg/5 mL **OR** acetaminophen  Diet Orders (From admission, onward)     Start     Ordered   02/13/21 1453  Diet Heart Room service appropriate? Yes; Fluid consistency: Thin  Diet effective now       Comments: Chop meats, add gravy  Question Answer Comment  Room service appropriate? Yes   Fluid consistency: Thin      02/13/21 1452             DVT prophylaxis: Place TED hose Start: 02/14/21 1003 enoxaparin (LOVENOX) injection 40 mg Start: 02/13/21 0800     Code Status: DNR  Family Communication: Discussed with son over the phone  Status is: Inpatient  Remains inpatient appropriate because:Inpatient level of care appropriate due to severity of illness  Dispo: The patient is from: Home              Anticipated d/c is to: SNF              Patient currently is medically stable to d/c.   Difficult to place patient No   Level of care: Med-Surg  Consultants:  Neurology  Procedures:  2D echo Carotid Doppler  Microbiology  none  Antimicrobials: none    Objective: Vitals:   02/14/21 2343 02/15/21 0331 02/15/21 0503 02/15/21 0912  BP: (!) 154/111 (!) 181/68 (!) 168/71 (!) 143/69  Pulse: 67 68  62  Resp: 16 18  18   Temp: 97.6 F (36.4 C) (!) 97.5 F (36.4 C)    TempSrc: Oral Oral    SpO2: 100% 99%  98%  Weight:      Height:        Intake/Output Summary (Last 24 hours) at 02/15/2021 1208 Last data filed at 02/15/2021 0347 Gross per 24 hour  Intake --  Output 400 ml  Net -400 ml    Filed Weights   02/12/21 1718 02/13/21 0600  Weight: 63.5 kg 65.7 kg    Examination: Constitutional:  nad Respiratory: no wheezing Cardiovascular: RRR Neurologic: inconsistent exam but appears weaker on left  Data Reviewed: I have independently reviewed following labs and imaging studies  CBC: Recent Labs  Lab 02/12/21 1722 02/13/21 0415  WBC 3.8* 5.1  HGB 13.4 14.0  HCT 37.9 40.1  MCV 87.1 88.7  PLT 160 329    Basic Metabolic Panel: Recent Labs  Lab 02/12/21 1722 02/13/21 0415  NA 134*  --   K 3.9  --   CL 105  --   CO2 23  --   GLUCOSE 105*  --   BUN 14  --   CREATININE 0.66 0.72  CALCIUM 8.9  --     Liver Function Tests: No results for input(s): AST, ALT, ALKPHOS, BILITOT, PROT, ALBUMIN in the last 168 hours. Coagulation Profile: No results for input(s): INR, PROTIME in the last 168  hours. HbA1C: Recent Labs    02/13/21 0415  HGBA1C 5.4    CBG: Recent Labs  Lab 02/12/21 1716  GLUCAP 100*     Recent Results (from the past 240 hour(s))  Resp Panel by RT-PCR (Flu A&B, Covid)     Status: None   Collection Time: 02/13/21  1:55 AM  Result Value Ref Range Status   SARS Coronavirus 2 by RT PCR NEGATIVE NEGATIVE Final    Comment: (NOTE) SARS-CoV-2 target nucleic acids are NOT DETECTED.  The SARS-CoV-2 RNA is generally detectable in upper respiratory specimens during the acute phase of infection. The lowest concentration of SARS-CoV-2 viral copies this assay can detect is 138 copies/mL. A negative result does not preclude SARS-Cov-2 infection and should not be used as the sole basis for treatment or other patient management decisions. A negative result may occur with  improper specimen collection/handling, submission of specimen other than nasopharyngeal swab, presence of viral mutation(s) within the areas targeted by this assay, and inadequate number of viral copies(<138 copies/mL). A negative result must be combined with clinical observations, patient history, and epidemiological information. The expected result is Negative.  Fact Sheet for Patients:  EntrepreneurPulse.com.au  Fact Sheet for Healthcare Providers:  IncredibleEmployment.be  This test is no t yet approved or cleared by the [place] FDA and  has been authorized for detection and/or diagnosis of SARS-CoV-2 by FDA under an Emergency Use Authorization (EUA). This EUA will remain  in effect (meaning this test can be used) for the duration of the COVID-19 declaration under Section 564(b)(1) of the Act, 21 U.S.C.section 360bbb-3(b)(1), unless the authorization is terminated  or revoked sooner.       Influenza A by PCR NEGATIVE NEGATIVE Final   Influenza B by PCR NEGATIVE NEGATIVE Final    Comment: (NOTE) The Xpert Xpress SARS-CoV-2/FLU/RSV plus assay  is intended as an aid in the diagnosis of influenza from Nasopharyngeal swab specimens and should not be used as a sole basis for treatment. Nasal washings and aspirates are unacceptable for Xpert Xpress SARS-CoV-2/FLU/RSV testing.  Fact Sheet for Patients: EntrepreneurPulse.com.au  Fact Sheet for Healthcare Providers: IncredibleEmployment.be  This test is not yet approved or cleared by the [place] FDA and has been authorized for detection and/or diagnosis of SARS-CoV-2 by FDA under an Emergency Use Authorization (EUA). This EUA will remain in effect (meaning this test can be used) for the duration of the COVID-19 declaration under Section 564(b)(1) of the Act, 21 U.S.C. section 360bbb-3(b)(1), unless the authorization is terminated or revoked.  Performed at [facility], [address], [place], [place] [address]  Radiology Studies: No results found.   Marzetta Board, MD, PhD [facility]  Between 7 am - 7 pm I am available, please contact me via Amion (for emergencies) or Securechat (non urgent messages)  Between 7 pm - 7 am I am not available, please contact night coverage MD/APP via Amion

## 2021-02-16 NOTE — Progress Notes (Signed)
Physical Therapy Treatment Patient Details Name: Melanie Simpson MRN: [id number] DOB: [birth year] Today's Date: 02/16/2021    History of Present Illness Pt is a 87 yo female that presented to the emergency department because of concerns for left facial droop and weakness. MRI showed "1.5 cm acute ischemic right basal ganglia infarct." PMH of dementia, GERD, HTN.    PT Comments    Pt in bed, encouraged and agrees to get up for breakfast.  Cues to initiate bed mobility.  Increased time and needs min a x 1 mostly to raise upper body up off bed.  Steady in sitting.  Stood with min/mod a x 1 with post lean.  Aware of balance deficits but does start walking and is encouraged to stop and gain balance before she continues.  She does walk to/from bathroom with min a x 1 mostly for walker positioning and to prevent post LOB.  She is able to void and initiate self care.  Remained in recliner after session with breakfast and needs met.   Follow Up Recommendations  SNF     Equipment Recommendations       Recommendations for Other Services       Precautions / Restrictions Precautions Precautions: Fall Restrictions Weight Bearing Restrictions: No    Mobility  Bed Mobility Overal bed mobility: Needs Assistance Bed Mobility: Supine to Sit     Supine to sit: Min assist;HOB elevated     General bed mobility comments: cues and encouragement.  assist mostly to raise upper body    Transfers Overall transfer level: Needs assistance Equipment used: Rolling walker (2 wheeled) Transfers: Sit to/from Stand Sit to Stand: Min assist Stand pivot transfers: Min assist          Ambulation/Gait   Gait Distance (Feet): 15 Feet Assistive device: Rolling walker (2 wheeled) Gait Pattern/deviations: Step-to pattern;Step-through pattern;Decreased step length - right;Decreased step length - left;Trunk flexed Gait velocity: decreased   General Gait Details: small steps at times with overall cues  for safety and walker position.   Stairs             Wheelchair Mobility    Modified Rankin (Stroke Patients Only)       Balance Overall balance assessment: Needs assistance Sitting-balance support: Feet supported Sitting balance-Leahy Scale: Good Sitting balance - Comments: no LOB seated EOB or on BSC   Standing balance support: Bilateral upper extremity supported Standing balance-Leahy Scale: Poor Standing balance comment: posterior LOB frequently                            Cognition Arousal/Alertness: Awake/alert Behavior During Therapy: WFL for tasks assessed/performed Overall Cognitive Status: Within Functional Limits for tasks assessed                                        Exercises Other Exercises Other Exercises: to bathroom to void.  initiates independant self care    General Comments        Pertinent Vitals/Pain Pain Assessment: Faces Faces Pain Scale: Hurts a little bit Pain Location: knees Pain Descriptors / Indicators: Sore Pain Intervention(s): Limited activity within patient's tolerance;Monitored during session;Repositioned    Home Living                      Prior Function  PT Goals (current goals can now be found in the care plan section) Progress towards PT goals: Progressing toward goals    Frequency    7X/week      PT Plan Current plan remains appropriate    Co-evaluation              AM-PAC PT "6 Clicks" Mobility   Outcome Measure  Help needed turning from your back to your side while in a flat bed without using bedrails?: A Little Help needed moving from lying on your back to sitting on the side of a flat bed without using bedrails?: A Little Help needed moving to and from a bed to a chair (including a wheelchair)?: A Little Help needed standing up from a chair using your arms (e.g., wheelchair or bedside chair)?: A Little Help needed to walk in hospital room?: A  Little Help needed climbing 3-5 steps with a railing? : A Lot 6 Click Score: 17    End of Session Equipment Utilized During Treatment: Gait belt Activity Tolerance: Patient tolerated treatment well Patient left: in chair;with call bell/phone within reach;with chair alarm set Nurse Communication: Mobility status PT Visit Diagnosis: Other abnormalities of gait and mobility (R26.89);Difficulty in walking, not elsewhere classified (R26.2);Muscle weakness (generalized) (M62.81);Unsteadiness on feet (R26.81)     Time: 8303-2201 PT Time Calculation (min) (ACUTE ONLY): 25 min  Charges:  $Gait Training: 8-22 mins $Therapeutic Activity: 8-22 mins                     {Avonelle Viveros, PTA 02/16/21, 10:28 AM , 10:26 AM

## 2021-02-16 NOTE — Progress Notes (Signed)
PROGRESS NOTE  Melanie Simpson PJK:[id number] DOB: May 10, 1924 DOA: 02/12/2021 PCP: Kirk Ruths, MD   LOS: 3 days   Brief Narrative / Interim history: 87 year old female with HTN, dementia, comes into the hospital brought by her son due to couple of days of difficulty ambulating as well as left-sided facial droop.  Patient has been living with her son for the past 10 years, and he feels like this was a rather acute change but did have some progressive weakness for quite some time.  She was found to have a stroke and was admitted to the hospital.  Subjective / 24h Interval events: No complaints  Assessment & Plan: Principal Problem Acute CVA-MRI on admission showed 1.5 cm acute ischemic right basal ganglia infarct.  Neurology consulted and followed patient while hospitalized.  She underwent a stroke work-up with carotid Doppler without hemodynamically significant stenosis, 2D echo showed EF 60-65%, normal RV.  Lipid panel showed an LDL of 199, she was placed on a statin.  A1c was 5.4.  Neurology recommends aspirin and Plavix for 3 weeks followed by aspirin alone.  PT recommending SNF, placement pending  Active Problems Essential hypertension-allow permissive hypertension, gradually normalized 3 to 5 days  Dementia without behavioral disturbances-stable  Scheduled Meds:  aspirin EC  81 mg Oral Daily   atorvastatin  10 mg Oral QHS   clopidogrel  75 mg Oral Daily   enoxaparin (LOVENOX) injection  40 mg Subcutaneous Q24H   Continuous Infusions:   PRN Meds:.acetaminophen **OR** acetaminophen (TYLENOL) oral liquid 160 mg/5 mL **OR** acetaminophen  Diet Orders (From admission, onward)     Start     Ordered   02/13/21 1453  Diet Heart Room service appropriate? Yes; Fluid consistency: Thin  Diet effective now       Comments: Chop meats, add gravy  Question Answer Comment  Room service appropriate? Yes   Fluid consistency: Thin      02/13/21 1452            DVT  prophylaxis: Place TED hose Start: 02/14/21 1003 enoxaparin (LOVENOX) injection 40 mg Start: 02/13/21 0800     Code Status: DNR  Family Communication: Discussed with son over the phone  Status is: Inpatient  Remains inpatient appropriate because:Inpatient level of care appropriate due to severity of illness  Dispo: The patient is from: Home              Anticipated d/c is to: SNF              Patient currently is medically stable to d/c.   Difficult to place patient No   Level of care: Med-Surg  Consultants:  Neurology  Procedures:  2D echo Carotid Doppler  Microbiology  none  Antimicrobials: none    Objective: Vitals:   02/15/21 2202 02/16/21 0030 02/16/21 0606 02/16/21 0719  BP: (!) 162/89 (!) 160/85 (!) 168/96 (!) 163/75  Pulse: 73 75 72 68  Resp: 16  18 16   Temp: (!) 97.5 F (36.4 C) (!) 97.5 F (36.4 C) (!) 97.5 F (36.4 C) (!) 96.6 F (35.9 C)  TempSrc: Oral Oral Oral   SpO2: 99% 100% 97% 98%  Weight:      Height:        Intake/Output Summary (Last 24 hours) at 02/16/2021 1033 Last data filed at 02/16/2021 1030 Gross per 24 hour  Intake 120 ml  Output 600 ml  Net -480 ml    Filed Weights   02/12/21 1718 02/13/21 0600  Weight: 63.5  kg 65.7 kg    Examination: Constitutional: No distress Respiratory: No wheezing Cardiovascular: Heart is regular Neurologic: inconsistent exam but appears weaker on left  Data Reviewed: I have independently reviewed following labs and imaging studies  CBC: Recent Labs  Lab 02/12/21 1722 02/13/21 0415  WBC 3.8* 5.1  HGB 13.4 14.0  HCT 37.9 40.1  MCV 87.1 88.7  PLT 160 782    Basic Metabolic Panel: Recent Labs  Lab 02/12/21 1722 02/13/21 0415  NA 134*  --   K 3.9  --   CL 105  --   CO2 23  --   GLUCOSE 105*  --   BUN 14  --   CREATININE 0.66 0.72  CALCIUM 8.9  --     Liver Function Tests: No results for input(s): AST, ALT, ALKPHOS, BILITOT, PROT, ALBUMIN in the last 168 hours. Coagulation  Profile: No results for input(s): INR, PROTIME in the last 168 hours. HbA1C: No results for input(s): HGBA1C in the last 72 hours.  CBG: Recent Labs  Lab 02/12/21 1716  GLUCAP 100*     Recent Results (from the past 240 hour(s))  Resp Panel by RT-PCR (Flu A&B, Covid)     Status: None   Collection Time: 02/13/21  1:55 AM  Result Value Ref Range Status   SARS Coronavirus 2 by RT PCR NEGATIVE NEGATIVE Final    Comment: (NOTE) SARS-CoV-2 target nucleic acids are NOT DETECTED.  The SARS-CoV-2 RNA is generally detectable in upper respiratory specimens during the acute phase of infection. The lowest concentration of SARS-CoV-2 viral copies this assay can detect is 138 copies/mL. A negative result does not preclude SARS-Cov-2 infection and should not be used as the sole basis for treatment or other patient management decisions. A negative result may occur with  improper specimen collection/handling, submission of specimen other than nasopharyngeal swab, presence of viral mutation(s) within the areas targeted by this assay, and inadequate number of viral copies(<138 copies/mL). A negative result must be combined with clinical observations, patient history, and epidemiological information. The expected result is Negative.  Fact Sheet for Patients:  EntrepreneurPulse.com.au  Fact Sheet for Healthcare Providers:  IncredibleEmployment.be  This test is no t yet approved or cleared by the [place] FDA and  has been authorized for detection and/or diagnosis of SARS-CoV-2 by FDA under an Emergency Use Authorization (EUA). This EUA will remain  in effect (meaning this test can be used) for the duration of the COVID-19 declaration under Section 564(b)(1) of the Act, 21 U.S.C.section 360bbb-3(b)(1), unless the authorization is terminated  or revoked sooner.       Influenza A by PCR NEGATIVE NEGATIVE Final   Influenza B by PCR NEGATIVE NEGATIVE  Final    Comment: (NOTE) The Xpert Xpress SARS-CoV-2/FLU/RSV plus assay is intended as an aid in the diagnosis of influenza from Nasopharyngeal swab specimens and should not be used as a sole basis for treatment. Nasal washings and aspirates are unacceptable for Xpert Xpress SARS-CoV-2/FLU/RSV testing.  Fact Sheet for Patients: EntrepreneurPulse.com.au  Fact Sheet for Healthcare Providers: IncredibleEmployment.be  This test is not yet approved or cleared by the [place] FDA and has been authorized for detection and/or diagnosis of SARS-CoV-2 by FDA under an Emergency Use Authorization (EUA). This EUA will remain in effect (meaning this test can be used) for the duration of the COVID-19 declaration under Section 564(b)(1) of the Act, 21 U.S.C. section 360bbb-3(b)(1), unless the authorization is terminated or revoked.  Performed at [facility], [phone]  [address], [place], [place] [address]       Radiology Studies: No results found.   Marzetta Board, MD, PhD [facility]  Between 7 am - 7 pm I am available, please contact me via Amion (for emergencies) or Securechat (non urgent messages)  Between 7 pm - 7 am I am not available, please contact night coverage MD/APP via Amion

## 2021-02-17 NOTE — Progress Notes (Signed)
PROGRESS NOTE  Melanie Simpson ZOX:[id number] DOB: 1923-10-24 DOA: 02/12/2021 PCP: Kirk Ruths, MD   LOS: 4 days   Brief Narrative / Interim history: 87 year old female with HTN, dementia, comes into the hospital brought by her son due to couple of days of difficulty ambulating as well as left-sided facial droop.  Patient has been living with her son for the past 10 years, and he feels like this was a rather acute change but did have some progressive weakness for quite some time.  She was found to have a stroke and was admitted to the hospital.  Subjective / 24h Interval events: No complaints   Assessment & Plan: Principal Problem Acute CVA-MRI on admission showed 1.5 cm acute ischemic right basal ganglia infarct.  Neurology consulted and followed patient while hospitalized.  She underwent a stroke work-up with carotid Doppler without hemodynamically significant stenosis, 2D echo showed EF 60-65%, normal RV.  Lipid panel showed an LDL of 199, she was placed on a statin.  A1c was 5.4.  Neurology recommends aspirin and Plavix for 3 weeks followed by aspirin alone.  PT recommending SNF, placement pending  Active Problems Essential hypertension-allow permissive hypertension, gradually normalized 3 to 5 days  Dementia without behavioral disturbances-stable  Scheduled Meds:  aspirin EC  81 mg Oral Daily   atorvastatin  10 mg Oral QHS   clopidogrel  75 mg Oral Daily   enoxaparin (LOVENOX) injection  40 mg Subcutaneous Q24H   Continuous Infusions:   PRN Meds:.acetaminophen **OR** acetaminophen (TYLENOL) oral liquid 160 mg/5 mL **OR** acetaminophen  Diet Orders (From admission, onward)     Start     Ordered   02/13/21 1453  Diet Heart Room service appropriate? Yes; Fluid consistency: Thin  Diet effective now       Comments: Chop meats, add gravy  Question Answer Comment  Room service appropriate? Yes   Fluid consistency: Thin      02/13/21 1452            DVT  prophylaxis: Place TED hose Start: 02/14/21 1003 enoxaparin (LOVENOX) injection 40 mg Start: 02/13/21 0800     Code Status: DNR  Family Communication: no family at bedside   Status is: Inpatient  Remains inpatient appropriate because:Inpatient level of care appropriate due to severity of illness  Dispo: The patient is from: Home              Anticipated d/c is to: SNF              Patient currently is medically stable to d/c.   Difficult to place patient No   Level of care: Med-Surg  Consultants:  Neurology  Procedures:  2D echo Carotid Doppler  Microbiology  none  Antimicrobials: none    Objective: Vitals:   02/16/21 2114 02/17/21 0434 02/17/21 0710 02/17/21 1118  BP: 140/64 (!) 126/49 (!) 143/65 (!) 151/61  Pulse: 70 64 67 61  Resp: 16 16 15 16   Temp: 97.8 F (36.6 C) 97.6 F (36.4 C) 97.6 F (36.4 C) (!) 97.5 F (36.4 C)  TempSrc: Oral Oral Oral Oral  SpO2: 92% 96% 98% 99%  Weight:      Height:        Intake/Output Summary (Last 24 hours) at 02/17/2021 1224 Last data filed at 02/17/2021 0945 Gross per 24 hour  Intake 120 ml  Output 450 ml  Net -330 ml    Filed Weights   02/12/21 1718 02/13/21 0600  Weight: 63.5 kg 65.7 kg  Examination: Constitutional: nad Respiratory: cta, no wheezing Cardiovascular: rrr  Data Reviewed: I have independently reviewed following labs and imaging studies  CBC: Recent Labs  Lab 02/12/21 1722 02/13/21 0415  WBC 3.8* 5.1  HGB 13.4 14.0  HCT 37.9 40.1  MCV 87.1 88.7  PLT 160 765    Basic Metabolic Panel: Recent Labs  Lab 02/12/21 1722 02/13/21 0415  NA 134*  --   K 3.9  --   CL 105  --   CO2 23  --   GLUCOSE 105*  --   BUN 14  --   CREATININE 0.66 0.72  CALCIUM 8.9  --     Liver Function Tests: No results for input(s): AST, ALT, ALKPHOS, BILITOT, PROT, ALBUMIN in the last 168 hours. Coagulation Profile: No results for input(s): INR, PROTIME in the last 168 hours. HbA1C: No results for  input(s): HGBA1C in the last 72 hours.  CBG: Recent Labs  Lab 02/12/21 1716  GLUCAP 100*     Recent Results (from the past 240 hour(s))  Resp Panel by RT-PCR (Flu A&B, Covid)     Status: None   Collection Time: 02/13/21  1:55 AM  Result Value Ref Range Status   SARS Coronavirus 2 by RT PCR NEGATIVE NEGATIVE Final    Comment: (NOTE) SARS-CoV-2 target nucleic acids are NOT DETECTED.  The SARS-CoV-2 RNA is generally detectable in upper respiratory specimens during the acute phase of infection. The lowest concentration of SARS-CoV-2 viral copies this assay can detect is 138 copies/mL. A negative result does not preclude SARS-Cov-2 infection and should not be used as the sole basis for treatment or other patient management decisions. A negative result may occur with  improper specimen collection/handling, submission of specimen other than nasopharyngeal swab, presence of viral mutation(s) within the areas targeted by this assay, and inadequate number of viral copies(<138 copies/mL). A negative result must be combined with clinical observations, patient history, and epidemiological information. The expected result is Negative.  Fact Sheet for Patients:  EntrepreneurPulse.com.au  Fact Sheet for Healthcare Providers:  IncredibleEmployment.be  This test is no t yet approved or cleared by the [place] FDA and  has been authorized for detection and/or diagnosis of SARS-CoV-2 by FDA under an Emergency Use Authorization (EUA). This EUA will remain  in effect (meaning this test can be used) for the duration of the COVID-19 declaration under Section 564(b)(1) of the Act, 21 U.S.C.section 360bbb-3(b)(1), unless the authorization is terminated  or revoked sooner.       Influenza A by PCR NEGATIVE NEGATIVE Final   Influenza B by PCR NEGATIVE NEGATIVE Final    Comment: (NOTE) The Xpert Xpress SARS-CoV-2/FLU/RSV plus assay is intended as an  aid in the diagnosis of influenza from Nasopharyngeal swab specimens and should not be used as a sole basis for treatment. Nasal washings and aspirates are unacceptable for Xpert Xpress SARS-CoV-2/FLU/RSV testing.  Fact Sheet for Patients: EntrepreneurPulse.com.au  Fact Sheet for Healthcare Providers: IncredibleEmployment.be  This test is not yet approved or cleared by the [place] FDA and has been authorized for detection and/or diagnosis of SARS-CoV-2 by FDA under an Emergency Use Authorization (EUA). This EUA will remain in effect (meaning this test can be used) for the duration of the COVID-19 declaration under Section 564(b)(1) of the Act, 21 U.S.C. section 360bbb-3(b)(1), unless the authorization is terminated or revoked.  Performed at [facility], [address] [place], [place] [address]       Radiology Studies: No results  found.   Marzetta Board, MD, PhD [facility]  Between 7 am - 7 pm I am available, please contact me via Amion (for emergencies) or Securechat (non urgent messages)  Between 7 pm - 7 am I am not available, please contact night coverage MD/APP via Amion

## 2021-02-17 NOTE — Progress Notes (Signed)
Physical Therapy Treatment Patient Details Name: Melanie Simpson MRN: [id number] DOB: 02-Jun-1924 Today's Date: 02/17/2021    History of Present Illness Pt is a 87 yo female that presented to the emergency department because of concerns for left facial droop and weakness. MRI showed "1.5 cm acute ischemic right basal ganglia infarct." PMH of dementia, GERD, HTN.    PT Comments    Pt initially refusing session but agreed with mod encouragement.  She gets to EOB with min a x 1 and verbal/tactile cues.  Stands with min a x 1 and walks to/from bathroom with RW and min a x 1 progressing to mod a x 1.  Pt with poor steps today and cues to increase step height and length.  She requires increasing assist on return trip and has difficulty turning to chair sitting before fully turned with mod/max a x 1 to sit in chair vs floor.    Discussed with nurse tech mobility and needing increased assist today.  She stated she needed more help yesterday afternoon as well.  She does seem more irritable today stating her back/neck and leg is broken during different times during session.  When asked more she stated "You did it."  She remains in recliner with needs met.   Follow Up Recommendations  SNF     Equipment Recommendations  Other (comment)    Recommendations for Other Services       Precautions / Restrictions Precautions Precautions: Fall Restrictions Weight Bearing Restrictions: No    Mobility  Bed Mobility Overal bed mobility: Needs Assistance Bed Mobility: Supine to Sit     Supine to sit: Min assist          Transfers Overall transfer level: Needs assistance Equipment used: Rolling walker (2 wheeled) Transfers: Sit to/from Stand Sit to Stand: Min assist            Ambulation/Gait Ambulation/Gait assistance: Min assist;Mod assist Gait Distance (Feet): 15 Feet Assistive device: Rolling walker (2 wheeled) Gait Pattern/deviations: Step-to pattern;Step-through  pattern;Decreased step length - right;Decreased step length - left;Trunk flexed Gait velocity: decreased   General Gait Details: overall poor gait quality today with poor steps and sitting before fully turning   Stairs             Wheelchair Mobility    Modified Rankin (Stroke Patients Only)       Balance Overall balance assessment: Needs assistance Sitting-balance support: Feet supported Sitting balance-Leahy Scale: Good Sitting balance - Comments: no LOB seated EOB or on BSC   Standing balance support: Bilateral upper extremity supported Standing balance-Leahy Scale: Poor Standing balance comment: posterior LOB frequently                            Cognition Arousal/Alertness: Awake/alert Behavior During Therapy: WFL for tasks assessed/performed;Agitated Overall Cognitive Status: Within Functional Limits for tasks assessed                                 General Comments: seemed a bit grumpy today.      Exercises Other Exercises Other Exercises: to bathroom to void.  initiates independant self care    General Comments        Pertinent Vitals/Pain Pain Assessment: Faces Faces Pain Scale: Hurts little more Pain Location: all over Pain Descriptors / Indicators: Sore Pain Intervention(s): Limited activity within patient's tolerance;Repositioned;Monitored during session    Home Living  Prior Function            PT Goals (current goals can now be found in the care plan section) Progress towards PT goals: Progressing toward goals    Frequency    7X/week      PT Plan Current plan remains appropriate    Co-evaluation              AM-PAC PT "6 Clicks" Mobility   Outcome Measure  Help needed turning from your back to your side while in a flat bed without using bedrails?: A Little Help needed moving from lying on your back to sitting on the side of a flat bed without using bedrails?: A  Little Help needed moving to and from a bed to a chair (including a wheelchair)?: A Little Help needed standing up from a chair using your arms (e.g., wheelchair or bedside chair)?: A Little Help needed to walk in hospital room?: A Lot Help needed climbing 3-5 steps with a railing? : A Lot 6 Click Score: 16    End of Session Equipment Utilized During Treatment: Gait belt Activity Tolerance: Patient limited by fatigue;Other (comment) Patient left: in chair;with call bell/phone within reach;with chair alarm set Nurse Communication: Mobility status PT Visit Diagnosis: Other abnormalities of gait and mobility (R26.89);Difficulty in walking, not elsewhere classified (R26.2);Muscle weakness (generalized) (M62.81);Unsteadiness on feet (R26.81)     Time: 1188-6773 PT Time Calculation (min) (ACUTE ONLY): 18 min  Charges:  $Gait Training: 8-22 mins                    Chesley Noon, PTA 02/17/21, 12:41 PM , 12:36 PM

## 2021-02-17 NOTE — Care Management Important Message (Signed)
Important Message  Patient Details  Name: Melanie Simpson MRN: [id number] Date of Birth: 01-24-1924   Medicare Important Message Given:  Yes  Patient was sleeping so I left a copy of the Important Message from Medicare on her bedside table for her to review at convenience.   [name] [name] 02/17/2021, 10:59 AM

## 2021-02-17 NOTE — Plan of Care (Addendum)
Pt is confused and does not seem to remember what was taught. Pt had a 7-beat run of V-tach. MD was made aware. No new orders.

## 2021-02-17 NOTE — Progress Notes (Signed)
Occupational Therapy Treatment Patient Details Name: Melanie Simpson MRN: [id number] DOB: Jun 12, 1924 Today's Date: 02/17/2021    History of present illness Pt is a 87 yo female that presented to the emergency department because of concerns for left facial droop and weakness. MRI showed "1.5 cm acute ischemic right basal ganglia infarct." PMH of dementia, GERD, HTN.   OT comments  Pt seen for OT tx. Pt in recliner scooted far down, purewick and other items on the floor, gown twisted and half off. Pt states "I need to get out of here." Pt calm, agreeable to OT's assistance, and able to follow simple commands with verbal cues. Able to get scooted back in recliner. Min A to don gown properly. Pt requesting to return to bed for a nap. MOD A to stand with cues for hand placement to improve transfer with RW. MOD A to take a couple steps from recliner to the EOB with posterior lean throughout. Supervision to get BLE into bed. RN notified of pt's needs and return to bed. Pt progressing slowly, continues to benefit from skilled OT services to maximize return to PLOF. Continue to recommend SNF at this time.    Follow Up Recommendations  SNF    Equipment Recommendations  Other (comment) (TBD at next venue)    Recommendations for Other Services      Precautions / Restrictions Precautions Precautions: Fall Restrictions Weight Bearing Restrictions: No       Mobility Bed Mobility Overal bed mobility: Needs Assistance Bed Mobility: Sit to Supine       Sit to supine: Supervision        Transfers Overall transfer level: Needs assistance Equipment used: Rolling walker (2 wheeled) Transfers: Sit to/from Stand Sit to Stand: Mod assist         General transfer comment: cues for hand placement, MOD A to perform    Balance Overall balance assessment: Needs assistance Sitting-balance support: Feet supported Sitting balance-Leahy Scale: Good     Standing balance support: Bilateral  upper extremity supported Standing balance-Leahy Scale: Poor Standing balance comment: posterior LOB frequently                           ADL either performed or assessed with clinical judgement   ADL Overall ADL's : Needs assistance/impaired                 Upper Body Dressing : Minimal assistance;Sitting                           Vision       Perception     Praxis      Cognition Arousal/Alertness: Awake/alert Behavior During Therapy: WFL for tasks assessed/performed Overall Cognitive Status: No family/caregiver present to determine baseline cognitive functioning                                 General Comments: alert, oriented to self, follows simple commands with cues        Exercises     Shoulder Instructions       General Comments      Pertinent Vitals/ Pain       Pain Assessment: Faces Faces Pain Scale: No hurt  Home Living  Prior Functioning/Environment              Frequency  Min 2X/week        Progress Toward Goals  OT Goals(current goals can now be found in the care plan section)  Progress towards OT goals: Progressing toward goals  Acute Rehab OT Goals Patient Stated Goal: go home OT Goal Formulation: With patient Time For Goal Achievement: 02/27/21 Potential to Achieve Goals: Good  Plan Discharge plan remains appropriate;Frequency remains appropriate    Co-evaluation                 AM-PAC OT "6 Clicks" Daily Activity     Outcome Measure   Help from another person eating meals?: A Little Help from another person taking care of personal grooming?: A Little Help from another person toileting, which includes using toliet, bedpan, or urinal?: A Lot Help from another person bathing (including washing, rinsing, drying)?: A Lot Help from another person to put on and taking off regular upper body clothing?: A Little Help from  another person to put on and taking off regular lower body clothing?: A Lot 6 Click Score: 15    End of Session Equipment Utilized During Treatment: Gait belt;Rolling walker  OT Visit Diagnosis: Other abnormalities of gait and mobility (R26.89);Hemiplegia and hemiparesis;Muscle weakness (generalized) (M62.81) Hemiplegia - Right/Left: Left Hemiplegia - dominant/non-dominant: Dominant Hemiplegia - caused by: Cerebral infarction   Activity Tolerance Patient tolerated treatment well   Patient Left in bed;with call bell/phone within reach;with bed alarm set   Nurse Communication Mobility status        Time: 9798-9211 OT Time Calculation (min): 13 min  Charges: OT General Charges $OT Visit: 1 Visit OT Treatments $Self Care/Home Management : 8-22 mins  Hanley Hays, MPH, MS, OTR/L ascom [phone] 02/17/21, 5:04 PM

## 2021-02-18 LAB — RESP PANEL BY RT-PCR (FLU A&B, COVID) ARPGX2
Influenza A by PCR: NEGATIVE
Influenza B by PCR: NEGATIVE
SARS Coronavirus 2 by RT PCR: NEGATIVE

## 2021-02-18 NOTE — TOC Progression Note (Signed)
Transition of Care [facility]) - Progression Note    Patient Details  Name: Melanie Simpson MRN: [id number] Date of Birth: 1924/07/23  Transition of Care [facility]) CM/SW Contact  Shelbie Hutching, RN Phone Number: 02/18/2021, 11:01 AM  Clinical Narrative:    [facility] is pending.  [facility] is working on verifying Medicare benefits.   Patient's son has called Medicare and they have the correct birth date on file.  TOC will follow up with [facility] later today.  Son really prefers for patient to go to [facility] over any other facility.    Expected Discharge Plan: [place] Barriers to Discharge: Continued Medical Work up  Expected Discharge Plan and Services Expected Discharge Plan: [place] In-house Referral: NA   Post Acute Care Choice: [place] Living arrangements for the past 2 months: Single Family Home                                       Social Determinants of Health (SDOH) Interventions    Readmission Risk Interventions No flowsheet data found.

## 2021-02-18 NOTE — Progress Notes (Signed)
To whom it may concern:  This patient has a primary diagnosis of Dementia that supersedes any psychiatric diagnosis.

## 2021-02-18 NOTE — Plan of Care (Signed)
  Problem: Skin Integrity: Goal: Risk for impaired skin integrity will decrease Outcome: Progressing   Problem: Coping: Goal: Level of anxiety will decrease Outcome: Not Progressing   Problem: Education: Goal: Knowledge of disease or condition will improve Outcome: Not Progressing

## 2021-02-18 NOTE — Progress Notes (Signed)
Physical Therapy Treatment Patient Details Name: Melanie Simpson MRN: [id number] DOB: 10/29/1923 Today's Date: 02/18/2021    History of Present Illness Pt is a 87 yo female that presented to the emergency department because of concerns for left facial droop and weakness. MRI showed "1.5 cm acute ischemic right basal ganglia infarct." PMH of dementia, GERD, HTN.    PT Comments    Pt agrees on second attempt. To EOB with min a x 1 and much encouragement.  Sitting generally steady.  Stands with mod a x 1 and is able to get to bedside commode and to recliner with min/mod a x 1.  Steps improved today but gait remains limited.  Difficult to tell if it is progressive weakness or motivation.  SNF remains appropriate for discharge.   Follow Up Recommendations  SNF     Equipment Recommendations  Other (comment)    Recommendations for Other Services       Precautions / Restrictions Precautions Precautions: Fall Restrictions Weight Bearing Restrictions: No    Mobility  Bed Mobility Overal bed mobility: Needs Assistance Bed Mobility: Supine to Sit     Supine to sit: Min assist          Transfers Overall transfer level: Needs assistance Equipment used: Rolling walker (2 wheeled) Transfers: Sit to/from Stand Sit to Stand: Mod assist            Ambulation/Gait Ambulation/Gait assistance: Min assist;Mod assist Gait Distance (Feet): 5 Feet Assistive device: Rolling walker (2 wheeled) Gait Pattern/deviations: Step-to pattern;Step-through pattern;Decreased step length - right;Decreased step length - left;Trunk flexed Gait velocity: decreased   General Gait Details: somewhat improved gait but distance limited.  unsure if it is progressive weakness or motivation   Marine scientist Rankin (Stroke Patients Only)       Balance Overall balance assessment: Needs assistance Sitting-balance support: Feet supported Sitting  balance-Leahy Scale: Good     Standing balance support: Bilateral upper extremity supported Standing balance-Leahy Scale: Poor Standing balance comment: posterior LOB frequently                            Cognition Arousal/Alertness: Awake/alert Behavior During Therapy: WFL for tasks assessed/performed Overall Cognitive Status: No family/caregiver present to determine baseline cognitive functioning                                        Exercises Other Exercises Other Exercises: to commode at bedside to void and for pericare/bathing of area    General Comments        Pertinent Vitals/Pain Pain Assessment: No/denies pain    Home Living                      Prior Function            PT Goals (current goals can now be found in the care plan section) Progress towards PT goals: Progressing toward goals    Frequency    7X/week      PT Plan Current plan remains appropriate    Co-evaluation              AM-PAC PT "6 Clicks" Mobility   Outcome Measure  Help needed turning from your back to your side while in a flat  bed without using bedrails?: A Little Help needed moving from lying on your back to sitting on the side of a flat bed without using bedrails?: A Little Help needed moving to and from a bed to a chair (including a wheelchair)?: A Little Help needed standing up from a chair using your arms (e.g., wheelchair or bedside chair)?: A Little Help needed to walk in hospital room?: A Lot Help needed climbing 3-5 steps with a railing? : A Lot 6 Click Score: 16    End of Session Equipment Utilized During Treatment: Gait belt Activity Tolerance: Patient tolerated treatment well;Patient limited by fatigue Patient left: in chair;with call bell/phone within reach;with chair alarm set Nurse Communication: Mobility status PT Visit Diagnosis: Other abnormalities of gait and mobility (R26.89);Difficulty in walking, not elsewhere  classified (R26.2);Muscle weakness (generalized) (M62.81);Unsteadiness on feet (R26.81)     Time: 7366-8159 PT Time Calculation (min) (ACUTE ONLY): 14 min  Charges:  $Therapeutic Activity: 8-22 mins                    Chesley Noon, PTA 02/18/21, 10:46 AM , 10:44 AM

## 2021-02-18 NOTE — TOC Progression Note (Signed)
Transition of Care [facility]) - Progression Note    Patient Details  Name: Melanie Simpson MRN: [id number] Date of Birth: 12/12/1923  Transition of Care [facility]) CM/SW Contact  Shelbie Hutching, RN Phone Number: 02/18/2021, 1:46 PM  Clinical Narrative:    [facility] is back [phone] [id number].  [facility] still cannot pull her Medicare.  RNCM asked son to get her Medicare card so I can giver her number to Admitting and registration because they have not been able to verify her Medicare either.     Expected Discharge Plan: [place] Barriers to Discharge: Continued Medical Work up  Expected Discharge Plan and Services Expected Discharge Plan: [place] In-house Referral: NA   Post Acute Care Choice: [place] Living arrangements for the past 2 months: Single Family Home                                       Social Determinants of Health (SDOH) Interventions    Readmission Risk Interventions No flowsheet data found.

## 2021-02-18 NOTE — Progress Notes (Signed)
PROGRESS NOTE  Melanie Simpson ACZ:[id number] DOB: Apr 03, 1924 DOA: 02/12/2021 PCP: Kirk Ruths, MD   LOS: 5 days   Brief Narrative / Interim history: 87 year old female with HTN, dementia, comes into the hospital brought by her son due to couple of days of difficulty ambulating as well as left-sided facial droop.  Patient has been living with her son for the past 10 years, and he feels like this was a rather acute change but did have some progressive weakness for quite some time.  She was found to have a stroke and was admitted to the hospital.  Subjective / 24h Interval events: No complaints   Assessment & Plan: Principal Problem Acute CVA-MRI on admission showed 1.5 cm acute ischemic right basal ganglia infarct.  Neurology consulted and followed patient while hospitalized.  She underwent a stroke work-up with carotid Doppler without hemodynamically significant stenosis, 2D echo showed EF 60-65%, normal RV.  Lipid panel showed an LDL of 199, she was placed on a statin.  A1c was 5.4.  Neurology recommends aspirin and Plavix for 3 weeks followed by aspirin alone.  PT recommending SNF, placement pending  Active Problems Essential hypertension-allow permissive hypertension, blood pressure likely within her baseline currently  Dementia without behavioral disturbances-stable  Scheduled Meds:  aspirin EC  81 mg Oral Daily   atorvastatin  10 mg Oral QHS   clopidogrel  75 mg Oral Daily   enoxaparin (LOVENOX) injection  40 mg Subcutaneous Q24H   Continuous Infusions:   PRN Meds:.acetaminophen **OR** acetaminophen (TYLENOL) oral liquid 160 mg/5 mL **OR** acetaminophen  Diet Orders (From admission, onward)     Start     Ordered   02/13/21 1453  Diet Heart Room service appropriate? Yes; Fluid consistency: Thin  Diet effective now       Comments: Chop meats, add gravy  Question Answer Comment  Room service appropriate? Yes   Fluid consistency: Thin      02/13/21 1452             DVT prophylaxis: Place TED hose Start: 02/14/21 1003 enoxaparin (LOVENOX) injection 40 mg Start: 02/13/21 0800     Code Status: DNR  Family Communication: no family at bedside   Status is: Inpatient  Remains inpatient appropriate because:Inpatient level of care appropriate due to severity of illness  Dispo: The patient is from: Home              Anticipated d/c is to: SNF              Patient currently is medically stable to d/c.   Difficult to place patient No   Level of care: Med-Surg  Consultants:  Neurology  Procedures:  2D echo Carotid Doppler  Microbiology  none  Antimicrobials: none    Objective: Vitals:   02/18/21 0021 02/18/21 0651 02/18/21 0832 02/18/21 1209  BP: (!) 144/67 (!) 152/59 (!) 138/51 (!) 146/88  Pulse: 81 65 62 68  Resp: 16 16 15 15   Temp: 97.6 F (36.4 C) (!) 97.3 F (36.3 C) 97.6 F (36.4 C) 97.9 F (36.6 C)  TempSrc: Oral Oral Oral   SpO2: 95% 99% 98% 97%  Weight:      Height:        Intake/Output Summary (Last 24 hours) at 02/18/2021 1323 Last data filed at 02/18/2021 1029 Gross per 24 hour  Intake 0 ml  Output --  Net 0 ml    Filed Weights   02/12/21 1718 02/13/21 0600  Weight: 63.5 kg 65.7 kg  Examination: Constitutional: nad Respiratory: cta, no wheezing Cardiovascular: rrr  Data Reviewed: I have independently reviewed following labs and imaging studies  CBC: Recent Labs  Lab 02/12/21 1722 02/13/21 0415  WBC 3.8* 5.1  HGB 13.4 14.0  HCT 37.9 40.1  MCV 87.1 88.7  PLT 160 540    Basic Metabolic Panel: Recent Labs  Lab 02/12/21 1722 02/13/21 0415  NA 134*  --   K 3.9  --   CL 105  --   CO2 23  --   GLUCOSE 105*  --   BUN 14  --   CREATININE 0.66 0.72  CALCIUM 8.9  --     Liver Function Tests: No results for input(s): AST, ALT, ALKPHOS, BILITOT, PROT, ALBUMIN in the last 168 hours. Coagulation Profile: No results for input(s): INR, PROTIME in the last 168 hours. HbA1C: No results  for input(s): HGBA1C in the last 72 hours.  CBG: Recent Labs  Lab 02/12/21 1716  GLUCAP 100*     Recent Results (from the past 240 hour(s))  Resp Panel by RT-PCR (Flu A&B, Covid)     Status: None   Collection Time: 02/13/21  1:55 AM  Result Value Ref Range Status   SARS Coronavirus 2 by RT PCR NEGATIVE NEGATIVE Final    Comment: (NOTE) SARS-CoV-2 target nucleic acids are NOT DETECTED.  The SARS-CoV-2 RNA is generally detectable in upper respiratory specimens during the acute phase of infection. The lowest concentration of SARS-CoV-2 viral copies this assay can detect is 138 copies/mL. A negative result does not preclude SARS-Cov-2 infection and should not be used as the sole basis for treatment or other patient management decisions. A negative result may occur with  improper specimen collection/handling, submission of specimen other than nasopharyngeal swab, presence of viral mutation(s) within the areas targeted by this assay, and inadequate number of viral copies(<138 copies/mL). A negative result must be combined with clinical observations, patient history, and epidemiological information. The expected result is Negative.  Fact Sheet for Patients:  EntrepreneurPulse.com.au  Fact Sheet for Healthcare Providers:  IncredibleEmployment.be  This test is no t yet approved or cleared by the [place] FDA and  has been authorized for detection and/or diagnosis of SARS-CoV-2 by FDA under an Emergency Use Authorization (EUA). This EUA will remain  in effect (meaning this test can be used) for the duration of the COVID-19 declaration under Section 564(b)(1) of the Act, 21 U.S.C.section 360bbb-3(b)(1), unless the authorization is terminated  or revoked sooner.       Influenza A by PCR NEGATIVE NEGATIVE Final   Influenza B by PCR NEGATIVE NEGATIVE Final    Comment: (NOTE) The Xpert Xpress SARS-CoV-2/FLU/RSV plus assay is intended as an  aid in the diagnosis of influenza from Nasopharyngeal swab specimens and should not be used as a sole basis for treatment. Nasal washings and aspirates are unacceptable for Xpert Xpress SARS-CoV-2/FLU/RSV testing.  Fact Sheet for Patients: EntrepreneurPulse.com.au  Fact Sheet for Healthcare Providers: IncredibleEmployment.be  This test is not yet approved or cleared by the [place] FDA and has been authorized for detection and/or diagnosis of SARS-CoV-2 by FDA under an Emergency Use Authorization (EUA). This EUA will remain in effect (meaning this test can be used) for the duration of the COVID-19 declaration under Section 564(b)(1) of the Act, 21 U.S.C. section 360bbb-3(b)(1), unless the authorization is terminated or revoked.  Performed at [facility], [address] [place], [place] [address]       Radiology Studies: No results  found.   Marzetta Board, MD, PhD [facility]  Between 7 am - 7 pm I am available, please contact me via Amion (for emergencies) or Securechat (non urgent messages)  Between 7 pm - 7 am I am not available, please contact night coverage MD/APP via Amion

## 2021-02-18 NOTE — Progress Notes (Signed)
To whom it may concern:  The above named patient will require a short term nursing home stay- anticipated 30 days or less for rehabilitation and strengthening.  The plan is for return home.

## 2021-02-18 NOTE — Progress Notes (Signed)
Pt refused her HS medication and assessments. Qshift NIH scale not completed d/t refusal

## 2021-02-19 LAB — CBC
HCT: 34.6 % — ABNORMAL LOW (ref 36.0–46.0)
Hemoglobin: 12.1 g/dL (ref 12.0–15.0)
MCH: 31.2 pg (ref 26.0–34.0)
MCHC: 35 g/dL (ref 30.0–36.0)
MCV: 89.2 fL (ref 80.0–100.0)
Platelets: 155 10*3/uL (ref 150–400)
RBC: 3.88 MIL/uL (ref 3.87–5.11)
RDW: 13.2 % (ref 11.5–15.5)
WBC: 4.3 10*3/uL (ref 4.0–10.5)
nRBC: 0 % (ref 0.0–0.2)

## 2021-02-19 LAB — BASIC METABOLIC PANEL
Anion gap: 3 — ABNORMAL LOW (ref 5–15)
BUN: 16 mg/dL (ref 8–23)
CO2: 29 mmol/L (ref 22–32)
Calcium: 8.5 mg/dL — ABNORMAL LOW (ref 8.9–10.3)
Chloride: 104 mmol/L (ref 98–111)
Creatinine, Ser: 0.74 mg/dL (ref 0.44–1.00)
GFR, Estimated: >60 mL/min (ref >60)
Glucose, Bld: 100 mg/dL — ABNORMAL HIGH (ref 70–99)
Potassium: 3.5 mmol/L (ref 3.5–5.1)
Sodium: 136 mmol/L (ref 135–145)

## 2021-02-19 MED ORDER — BISACODYL 10 MG RE SUPP
10.0000 mg | Freq: Once | RECTAL | Status: AC
  Administered 2021-02-19: 10 mg via RECTAL
  Filled 2021-02-19: qty 1

## 2021-02-19 MED ORDER — SENNOSIDES-DOCUSATE SODIUM 8.6-50 MG PO TABS: 1.0000 | ORAL_TABLET | Freq: Two times a day (BID) | ORAL | Status: AC

## 2021-02-19 MED ORDER — ATORVASTATIN CALCIUM 20 MG PO TABS: 10.0000 mg | ORAL_TABLET | Freq: Every day | ORAL | Status: AC

## 2021-02-19 MED ORDER — CLOPIDOGREL BISULFATE 75 MG PO TABS: 75.0000 mg | ORAL_TABLET | Freq: Every day | ORAL | Status: AC

## 2021-02-19 MED ORDER — ASPIRIN 81 MG PO TBEC: 81.0000 mg | DELAYED_RELEASE_TABLET | Freq: Every day | ORAL | 11 refills | Status: AC

## 2021-02-19 MED ORDER — SENNOSIDES-DOCUSATE SODIUM 8.6-50 MG PO TABS
1.0000 | ORAL_TABLET | Freq: Two times a day (BID) | ORAL | Status: DC
  Administered 2021-02-19 – 2021-02-21 (×5): 1 via ORAL
  Filled 2021-02-19 (×5): qty 1

## 2021-02-19 NOTE — Progress Notes (Signed)
Physical Therapy Treatment Patient Details Name: Melanie Simpson MRN: [id number] DOB: 1923/11/13 Today's Date: 02/19/2021    History of Present Illness Pt is a 87 yo female that presented to the emergency department because of concerns for left facial droop and weakness. MRI showed "1.5 cm acute ischemic right basal ganglia infarct." PMH of dementia, GERD, HTN.    PT Comments    Pt remains pleasantly confused, c/o soreness throughout.  Received in bed, MinA for bed mobility, MinA for sit<> stand transfers from various surfaces and repeated vc's for proper technique and safety awareness. Short distance gait with RW 71ft. Pt positioned to comfort in recliner with call bell and chair alarm on.     Follow Up Recommendations  SNF     Equipment Recommendations       Recommendations for Other Services       Precautions / Restrictions Precautions Precautions: Fall Restrictions Weight Bearing Restrictions: No    Mobility  Bed Mobility Overal bed mobility: Needs Assistance Bed Mobility: Supine to Sit     Supine to sit: Min assist Sit to supine: Supervision   General bed mobility comments: cues and encouragement.  assist mostly to raise upper body    Transfers Overall transfer level: Needs assistance Equipment used: Rolling walker (2 wheeled) Transfers: Sit to/from Stand Sit to Stand: Min assist Stand pivot transfers: Min assist       General transfer comment:  (Repeated vc's for safe technique, transfers from various surfaces)  Ambulation/Gait Ambulation/Gait assistance: Min assist (for safety) Gait Distance (Feet): 5 Feet Assistive device: Rolling walker (2 wheeled) Gait Pattern/deviations: Step-to pattern         Stairs             Wheelchair Mobility    Modified Rankin (Stroke Patients Only)       Balance                                            Cognition Arousal/Alertness: Awake/alert Behavior During Therapy: WFL for  tasks assessed/performed Overall Cognitive Status: No family/caregiver present to determine baseline cognitive functioning                                 General Comments: alert, oriented to self, follows simple commands with cues      Exercises General Exercises - Lower Extremity Ankle Circles/Pumps: Both;20 reps;Seated Long Arc Quad: Both;10 reps;Seated    General Comments General comments (skin integrity, edema, etc.): large bruise on left hip area      Pertinent Vitals/Pain Pain Assessment: Faces Faces Pain Scale: Hurts little more Pain Location: all over Pain Descriptors / Indicators: Sore Pain Intervention(s): Monitored during session    Home Living                      Prior Function            PT Goals (current goals can now be found in the care plan section) Acute Rehab PT Goals Patient Stated Goal: go home    Frequency    7X/week      PT Plan Current plan remains appropriate    Co-evaluation              AM-PAC PT "6 Clicks" Mobility   Outcome Measure  Help needed turning from  your back to your side while in a flat bed without using bedrails?: A Little Help needed moving from lying on your back to sitting on the side of a flat bed without using bedrails?: A Little Help needed moving to and from a bed to a chair (including a wheelchair)?: A Little Help needed standing up from a chair using your arms (e.g., wheelchair or bedside chair)?: A Little Help needed to walk in hospital room?: A Lot Help needed climbing 3-5 steps with a railing? : A Lot 6 Click Score: 16    End of Session Equipment Utilized During Treatment: Gait belt Activity Tolerance: Patient tolerated treatment well;Patient limited by fatigue Patient left: in chair;with call bell/phone within reach;with chair alarm set Nurse Communication: Mobility status PT Visit Diagnosis: Other abnormalities of gait and mobility (R26.89);Difficulty in walking, not  elsewhere classified (R26.2);Muscle weakness (generalized) (M62.81);Unsteadiness on feet (R26.81)     Time: 1500-1540 PT Time Calculation (min) (ACUTE ONLY): 40 min  Charges:  $Therapeutic Activity: 38-52 mins                    Mikel Cella, PTA    Melanie Simpson 02/19/2021, 3:38 PM

## 2021-02-19 NOTE — Progress Notes (Signed)
During report night shift states pt has not voided in 12 hours. No BM in 7 days. This Probation officer bladder scanned pt [id number] paged attending. See new orders for stool.

## 2021-02-19 NOTE — Discharge Summary (Addendum)
Physician Discharge Summary  Phelicia Dantes OQH:[id number] DOB: 1924-07-01 DOA: 02/12/2021  PCP: Kirk Ruths, MD  Admit date: 02/12/2021 Discharge date: 02/20/2021  Admitted From: home Discharge disposition: SNF   Recommendations for Outpatient Follow-Up:   aspirin and Plavix for 3 weeks followed by aspirin alone Bowel regimen New med: statin-- follow LFTs, FLP   Discharge Diagnosis:   Principal Problem:   Acute CVA (cerebrovascular accident) [facility]) Active Problems:   Hypothyroid   Essential hypertension   Dementia without behavioral disturbance ([place])   Stroke [facility])    Discharge Condition: Improved.  Diet recommendation: Low sodium, heart healthy.   Wound care: None.  Code status: DNR   History of Present Illness:  87 year old female with HTN, dementia, comes into the hospital brought by her son due to couple of days of difficulty ambulating as well as left-sided facial droop.  Patient has been living with her son for the past 10 years, and he feels like this was a rather acute change but did have some progressive weakness for quite some time.  She was found to have a stroke and was admitted to the hospital.   Hospital Course by Problem:   Acute CVA- MRI on admission showed 1.5 cm acute ischemic right basal ganglia infarct.  Neurology consulted and followed patient while hospitalized.  She underwent a stroke work-up with carotid Doppler without hemodynamically significant stenosis, 2D echo showed EF 60-65%, normal RV.  Lipid panel showed an LDL of 199, she was placed on a statin.  A1c was 5.4.  Neurology recommends aspirin and Plavix for 3 weeks followed by aspirin alone.  PT recommending SNF, placement pending   Essential hypertension -blood pressure likely within her baseline currently  Dementia without behavioral disturbances-stable    Medical Consultants:   neuro   Discharge Exam:   Vitals:   02/19/21 0503 02/19/21 0750  BP: (!)  150/135 92/80  Pulse: (!) 57 62  Resp: 16 20  Temp: 97.6 F (36.4 C) (!) 97.5 F (36.4 C)  SpO2: 100% 100%   Vitals:   02/18/21 1615 02/18/21 2050 02/19/21 0503 02/19/21 0750  BP: (!) 157/66 (!) 142/108 (!) 150/135 92/80  Pulse: 74 70 (!) 57 62  Resp: 17 14 16 20   Temp: 97.7 F (36.5 C) 97.6 F (36.4 C) 97.6 F (36.4 C) (!) 97.5 F (36.4 C)  TempSrc: Oral Oral  Oral  SpO2: 99% 97% 100% 100%  Weight:      Height:        General exam: Appears calm and comfortable. .    The results of significant diagnostics from this hospitalization (including imaging, microbiology, ancillary and laboratory) are listed below for reference.     Procedures and Diagnostic Studies:   US Carotid Bilateral (at [facility] and [facility] only)  Result Date: 02/13/2021 CLINICAL DATA:  87 year old female with a history of stroke EXAM: BILATERAL CAROTID DUPLEX ULTRASOUND TECHNIQUE: Pearline Cables scale imaging, color Doppler and duplex ultrasound were performed of bilateral carotid and vertebral arteries in the neck. COMPARISON:  None. FINDINGS: Criteria: Quantification of carotid stenosis is based on velocity parameters that correlate the residual internal carotid diameter with NASCET-based stenosis levels, using the diameter of the distal internal carotid lumen as the denominator for stenosis measurement. The following velocity measurements were obtained: RIGHT ICA:  Systolic 44 cm/sec, Diastolic 10 cm/sec CCA:  41 cm/sec SYSTOLIC ICA/CCA RATIO:  1.1 ECA:  71 cm/sec LEFT ICA:  Systolic 67 cm/sec, Diastolic 20 cm/sec CCA:  56  cm/sec SYSTOLIC ICA/CCA RATIO:  1.2 ECA:  47 cm/sec Right Brachial SBP: Not acquired Left Brachial SBP: Not acquired RIGHT CAROTID ARTERY: No significant calcifications of the right common carotid artery. Intermediate waveform maintained. Heterogeneous and partially calcified plaque at the right carotid bifurcation. No significant lumen shadowing. Low resistance waveform of the right ICA. No significant  tortuosity. RIGHT VERTEBRAL ARTERY: Antegrade flow with low resistance waveform. LEFT CAROTID ARTERY: No significant calcifications of the left common carotid artery. Intermediate waveform maintained. Heterogeneous and partially calcified plaque at the left carotid bifurcation without significant lumen shadowing. Low resistance waveform of the left ICA. No significant tortuosity. LEFT VERTEBRAL ARTERY:  Antegrade flow with low resistance waveform. IMPRESSION: Color duplex indicates minimal heterogeneous and calcified plaque, with no hemodynamically significant stenosis by duplex criteria in the extracranial cerebrovascular circulation. Signed, Dulcy Fanny. Dellia Nims, RPVI [facility] Radiology Electronically Signed   By: Corrie Mckusick D.O.   On: 02/13/2021 11:51   ECHOCARDIOGRAM COMPLETE  Result Date: 02/13/2021    ECHOCARDIOGRAM REPORT   Patient Name:   KRISTIANE MORSCH Date of Exam: 02/13/2021 Medical Rec #:  [id number]         Height:       63.0 in Accession #:    [id number]        Weight:       144.8 lb Date of Birth:  1924-07-25         BSA:          1.686 m Patient Age:    89 years          BP:           128/66 mmHg Patient Gender: F                 HR:           67 bpm. Exam Location:  [facility] Procedure: 2D Echo, Color Doppler and Cardiac Doppler Indications:     I63.9 Stroke  History:         Patient has no prior history of Echocardiogram examinations.                  Signs/Symptoms:Dementia; Risk Factors:Hypertension.  Sonographer:     Charmayne Sheer RDCS (AE) Referring Phys:  [id number] Athena Masse Diagnosing Phys: Serafina Royals MD  Sonographer Comments: Technically challenging study due to limited acoustic windows. Image acquisition challenging due to uncooperative patient. IMPRESSIONS  1. Left ventricular ejection fraction, by estimation, is 60 to 65%. The left ventricle has normal function. The left ventricle has no regional wall motion abnormalities. Left  ventricular diastolic parameters were normal.  2. Right ventricular systolic function is normal. The right ventricular size is normal.  3. The mitral valve is normal in structure. Moderate mitral valve regurgitation.  4. The aortic valve is normal in structure. Aortic valve regurgitation is trivial. FINDINGS  Left Ventricle: Left ventricular ejection fraction, by estimation, is 60 to 65%. The left ventricle has normal function. The left ventricle has no regional wall motion abnormalities. The left ventricular internal cavity size was normal in size. There is  no left ventricular hypertrophy. Left ventricular diastolic parameters were normal. Right Ventricle: The right ventricular size is normal. No increase in right ventricular wall thickness. Right ventricular systolic function is normal. Left Atrium: Left atrial size was normal in size. Right Atrium: Right atrial size was normal in size. Pericardium: There is no evidence of pericardial effusion. Mitral Valve: The mitral valve is  normal in structure. Moderate mitral valve regurgitation. MV peak gradient, 10.8 mmHg. The mean mitral valve gradient is 4.0 mmHg. Tricuspid Valve: The tricuspid valve is normal in structure. Tricuspid valve regurgitation is mild. Aortic Valve: The aortic valve is normal in structure. Aortic valve regurgitation is trivial. Aortic valve mean gradient measures 4.0 mmHg. Aortic valve peak gradient measures 7.0 mmHg. Aortic valve area, by VTI measures 2.68 cm. Pulmonic Valve: The pulmonic valve was normal in structure. Pulmonic valve regurgitation is not visualized. Aorta: The aortic root and ascending aorta are structurally normal, with no evidence of dilitation. IAS/Shunts: No atrial level shunt detected by color flow Doppler.  LEFT VENTRICLE PLAX 2D LVIDd:         3.40 cm  Diastology LVIDs:         2.30 cm  LV e' lateral:   5.98 cm/s LV PW:         1.20 cm  LV E/e' lateral: 8.5 LV IVS:        1.10 cm LVOT diam:     2.00 cm LV SV:          77 LV SV Index:   45 LVOT Area:     3.14 cm  LEFT ATRIUM           Index LA Vol (A4C): 46.5 ml 27.58 ml/m  AORTIC VALVE AV Area (Vmax):    2.78 cm AV Area (Vmean):   2.63 cm AV Area (VTI):     2.68 cm AV Vmax:           132.00 cm/s AV Vmean:          98.400 cm/s AV VTI:            0.286 m AV Peak Grad:      7.0 mmHg AV Mean Grad:      4.0 mmHg LVOT Vmax:         117.00 cm/s LVOT Vmean:        82.300 cm/s LVOT VTI:          0.244 m LVOT/AV VTI ratio: 0.85  AORTA Ao Root diam: 2.60 cm MITRAL VALVE MV Area (PHT): 5.97 cm     SHUNTS MV Area VTI:   2.23 cm     Systemic VTI:  0.24 m MV Peak grad:  10.8 mmHg    Systemic Diam: 2.00 cm MV Mean grad:  4.0 mmHg MV Vmax:       1.64 m/s MV Vmean:      94.3 cm/s MV Decel Time: 127 msec MV E velocity: 51.00 cm/s MV A velocity: 124.00 cm/s MV E/A ratio:  0.41 Serafina Royals MD Electronically signed by Serafina Royals MD Signature Date/Time: 02/13/2021/5:02:30 PM    Final      Labs:   Basic Metabolic Panel: Recent Labs  Lab 02/12/21 1722 02/13/21 0415 02/19/21 0631  NA 134*  --  136  K 3.9  --  3.5  CL 105  --  104  CO2 23  --  29  GLUCOSE 105*  --  100*  BUN 14  --  16  CREATININE 0.66 0.72 0.74  CALCIUM 8.9  --  8.5*   GFR Estimated Creatinine Clearance: 38.3 mL/min (by C-G formula based on SCr of 0.74 mg/dL). Liver Function Tests: No results for input(s): AST, ALT, ALKPHOS, BILITOT, PROT, ALBUMIN in the last 168 hours. No results for input(s): LIPASE, AMYLASE in the last 168 hours. No results for input(s): AMMONIA in the last 168 hours.  Coagulation profile No results for input(s): INR, PROTIME in the last 168 hours.  CBC: Recent Labs  Lab 02/12/21 1722 02/13/21 0415 02/19/21 0631  WBC 3.8* 5.1 4.3  HGB 13.4 14.0 12.1  HCT 37.9 40.1 34.6*  MCV 87.1 88.7 89.2  PLT 160 163 155   Cardiac Enzymes: No results for input(s): CKTOTAL, CKMB, CKMBINDEX, TROPONINI in the last 168 hours. BNP: Invalid input(s): POCBNP CBG: Recent Labs  Lab  02/12/21 1716  GLUCAP 100*   D-Dimer No results for input(s): DDIMER in the last 72 hours. Hgb A1c No results for input(s): HGBA1C in the last 72 hours. Lipid Profile No results for input(s): CHOL, HDL, LDLCALC, TRIG, CHOLHDL, LDLDIRECT in the last 72 hours. Thyroid function studies No results for input(s): TSH, T4TOTAL, T3FREE, THYROIDAB in the last 72 hours.  Invalid input(s): FREET3 Anemia work up No results for input(s): VITAMINB12, FOLATE, FERRITIN, TIBC, IRON, RETICCTPCT in the last 72 hours. Microbiology Recent Results (from the past 240 hour(s))  Resp Panel by RT-PCR (Flu A&B, Covid)     Status: None   Collection Time: 02/13/21  1:55 AM  Result Value Ref Range Status   SARS Coronavirus 2 by RT PCR NEGATIVE NEGATIVE Final    Comment: (NOTE) SARS-CoV-2 target nucleic acids are NOT DETECTED.  The SARS-CoV-2 RNA is generally detectable in upper respiratory specimens during the acute phase of infection. The lowest concentration of SARS-CoV-2 viral copies this assay can detect is 138 copies/mL. A negative result does not preclude SARS-Cov-2 infection and should not be used as the sole basis for treatment or other patient management decisions. A negative result may occur with  improper specimen collection/handling, submission of specimen other than nasopharyngeal swab, presence of viral mutation(s) within the areas targeted by this assay, and inadequate number of viral copies(<138 copies/mL). A negative result must be combined with clinical observations, patient history, and epidemiological information. The expected result is Negative.  Fact Sheet for Patients:  EntrepreneurPulse.com.au  Fact Sheet for Healthcare Providers:  IncredibleEmployment.be  This test is no t yet approved or cleared by the [place] FDA and  has been authorized for detection and/or diagnosis of SARS-CoV-2 by FDA under an Emergency Use Authorization (EUA).  This EUA will remain  in effect (meaning this test can be used) for the duration of the COVID-19 declaration under Section 564(b)(1) of the Act, 21 U.S.C.section 360bbb-3(b)(1), unless the authorization is terminated  or revoked sooner.       Influenza A by PCR NEGATIVE NEGATIVE Final   Influenza B by PCR NEGATIVE NEGATIVE Final    Comment: (NOTE) The Xpert Xpress SARS-CoV-2/FLU/RSV plus assay is intended as an aid in the diagnosis of influenza from Nasopharyngeal swab specimens and should not be used as a sole basis for treatment. Nasal washings and aspirates are unacceptable for Xpert Xpress SARS-CoV-2/FLU/RSV testing.  Fact Sheet for Patients: EntrepreneurPulse.com.au  Fact Sheet for Healthcare Providers: IncredibleEmployment.be  This test is not yet approved or cleared by the [place] FDA and has been authorized for detection and/or diagnosis of SARS-CoV-2 by FDA under an Emergency Use Authorization (EUA). This EUA will remain in effect (meaning this test can be used) for the duration of the COVID-19 declaration under Section 564(b)(1) of the Act, 21 U.S.C. section 360bbb-3(b)(1), unless the authorization is terminated or revoked.  Performed at [facility], [address] [place], [place] [address]   Resp Panel by RT-PCR (Flu A&B, Covid) Nasopharyngeal Swab     Status: None  Collection Time: 02/18/21  1:30 PM   Specimen: Nasopharyngeal Swab; Nasopharyngeal(NP) swabs in vial transport medium  Result Value Ref Range Status   SARS Coronavirus 2 by RT PCR NEGATIVE NEGATIVE Final    Comment: (NOTE) SARS-CoV-2 target nucleic acids are NOT DETECTED.  The SARS-CoV-2 RNA is generally detectable in upper respiratory specimens during the acute phase of infection. The lowest concentration of SARS-CoV-2 viral copies this assay can detect is 138 copies/mL. A negative result does not preclude SARS-Cov-2 infection and should not  be used as the sole basis for treatment or other patient management decisions. A negative result may occur with  improper specimen collection/handling, submission of specimen other than nasopharyngeal swab, presence of viral mutation(s) within the areas targeted by this assay, and inadequate number of viral copies(<138 copies/mL). A negative result must be combined with clinical observations, patient history, and epidemiological information. The expected result is Negative.  Fact Sheet for Patients:  EntrepreneurPulse.com.au  Fact Sheet for Healthcare Providers:  IncredibleEmployment.be  This test is no t yet approved or cleared by the [place] FDA and  has been authorized for detection and/or diagnosis of SARS-CoV-2 by FDA under an Emergency Use Authorization (EUA). This EUA will remain  in effect (meaning this test can be used) for the duration of the COVID-19 declaration under Section 564(b)(1) of the Act, 21 U.S.C.section 360bbb-3(b)(1), unless the authorization is terminated  or revoked sooner.       Influenza A by PCR NEGATIVE NEGATIVE Final   Influenza B by PCR NEGATIVE NEGATIVE Final    Comment: (NOTE) The Xpert Xpress SARS-CoV-2/FLU/RSV plus assay is intended as an aid in the diagnosis of influenza from Nasopharyngeal swab specimens and should not be used as a sole basis for treatment. Nasal washings and aspirates are unacceptable for Xpert Xpress SARS-CoV-2/FLU/RSV testing.  Fact Sheet for Patients: EntrepreneurPulse.com.au  Fact Sheet for Healthcare Providers: IncredibleEmployment.be  This test is not yet approved or cleared by the [place] FDA and has been authorized for detection and/or diagnosis of SARS-CoV-2 by FDA under an Emergency Use Authorization (EUA). This EUA will remain in effect (meaning this test can be used) for the duration of the COVID-19 declaration under Section  564(b)(1) of the Act, 21 U.S.C. section 360bbb-3(b)(1), unless the authorization is terminated or revoked.  Performed at [facility], [address] [place], [place] [address]      Discharge Instructions:   Discharge Instructions     Ambulatory referral to Neurology   Complete by: As directed    An appointment is requested in approximately: 4 weeks   Diet - low sodium heart healthy   Complete by: As directed    Increase activity slowly   Complete by: As directed       Allergies as of 02/19/2021   No Known Allergies      Medication List     STOP taking these medications    meloxicam 7.5 MG tablet Commonly known as: MOBIC       TAKE these medications    aspirin 81 MG EC tablet Take 1 tablet (81 mg total) by mouth daily. Swallow whole.   atorvastatin 20 MG tablet Commonly known as: LIPITOR Take 0.5 tablets (10 mg total) by mouth at bedtime.   clopidogrel 75 MG tablet Commonly known as: PLAVIX Take 1 tablet (75 mg total) by mouth daily.   senna-docusate 8.6-50 MG tablet Commonly known as: Senokot-S Take 1 tablet by mouth 2 (two) times daily.  Follow-up Information     Kirk Ruths, MD Follow up in 1 week(s).   Specialty: Internal Medicine Contact information: [place] [address] [phone]                  Time coordinating discharge: 35 min  Signed:  Geradine Girt DO  [facility] 02/19/2021, 9:12 AM

## 2021-02-19 NOTE — TOC Progression Note (Addendum)
Transition of Care [facility]) - Progression Note    Patient Details  Name: Melanie Simpson MRN: [id number] Date of Birth: 05/02/1924  [facility]) CM/SW [place], LCSW Phone Number: 02/19/2021, 10:12 AM  Clinical Narrative:   Left message for [facility] admissions coordinator to check status of verifying her insurance. PASARR obtained: [id number].  10:39 am: Received call from son. SS# that we have in our system is incorrect and that is why we have not been able to verify her insurance. He provided correct SS#: [id number]. Gave to [facility] admissions coordinator so she can verify.  1:16 pm: Uploaded clinicals into [place] portal for insurance authorization.  3:48 pm: [employer] authorization obtained. [facility] can accept her tomorrow. Son, Dr. Eliseo Simpson, and RN are aware.   Expected Discharge Plan: [place] Barriers to Discharge: Continued Medical Work up  Expected Discharge Plan and Services Expected Discharge Plan: [place] In-house Referral: NA   Post Acute Care Choice: [place] Living arrangements for the past 2 months: Single Family Home Expected Discharge Date: 02/19/21                                     Social Determinants of Health (SDOH) Interventions    Readmission Risk Interventions No flowsheet data found.

## 2021-02-20 LAB — CREATININE, SERUM
Creatinine, Ser: 0.66 mg/dL (ref 0.44–1.00)
GFR, Estimated: >60 mL/min (ref >60)

## 2021-02-20 NOTE — Plan of Care (Addendum)
Pt refused morning labs and VS. Pt isn't drinking enough fluids and hasn't voided all night.  Did do a bladder scan, but not sure it's accurate.  She was uncooperative.  Scan showed 199.

## 2021-02-20 NOTE — Care Management Important Message (Signed)
Important Message  Patient Details  Name: Melanie Simpson MRN: [id number] Date of Birth: 10-24-1923   Medicare Important Message Given:  Yes     [name] A Jamila Slatten 02/20/2021, 10:28 AM

## 2021-02-20 NOTE — Progress Notes (Addendum)
PROGRESS NOTE    Melanie Simpson  WYO:[id number] DOB: Aug 23, 1924 DOA: 02/12/2021 PCP: Kirk Ruths, MD   Assessment & Plan:   Principal Problem:   Acute CVA (cerebrovascular accident) [facility]) Active Problems:   Hypothyroid   Essential hypertension   Dementia without behavioral disturbance ([place])   Stroke ([place])  Acute CVA: of right basal ganglia as per MRI brain. Carotid US is w/o hemodynamically significant stenosis. Echo showed EF 60-65%, normal RV. Continue on aspirin, plavix x 3 weeks followed by aspirin alone as per neuro. Waiting on SNF placement   Dementia: continue w/ supportive care   HLD: continue on statin     DVT prophylaxis: lovenox  Code Status: DNR Family Communication:  Disposition Plan: waiting on SNF placement    Level of care: Med-Surg   Status is: Inpatient  Remains inpatient appropriate because:Unsafe d/c plan; waiting on SNF placement, no beds available   Dispo: The patient is from: Home              Anticipated d/c is to: SNF              Patient currently is medically stable to d/c.   Difficult to place patient : unclear        Consultants:  Neuro   Procedures:   Antimicrobials:    Subjective: Pt c/o malaise  Objective: Vitals:   02/19/21 2030 02/19/21 2353 02/20/21 0758 02/20/21 1207  BP: (!) 160/87 (!) 151/63 (!) 127/54 (!) 136/57  Pulse: 71 69 64 69  Resp: 16 16 15 17   Temp: (!) 97.4 F (36.3 C) 97.7 F (36.5 C) 98.2 F (36.8 C) 97.9 F (36.6 C)  TempSrc: Oral Oral Oral Oral  SpO2: 96% 99% 99% 99%  Weight:      Height:        Intake/Output Summary (Last 24 hours) at 02/20/2021 1319 Last data filed at 02/20/2021 1023 Gross per 24 hour  Intake 180 ml  Output --  Net 180 ml   Filed Weights   02/12/21 1718 02/13/21 0600  Weight: 63.5 kg 65.7 kg    Examination:  General exam: Appears calm and comfortable  Respiratory system: Clear to auscultation. Respiratory effort normal. Cardiovascular system: S1  & S2 +. No rubs, gallops or clicks. No pedal edema. Gastrointestinal system: Abdomen is nondistended, soft and nontender. Normal bowel sounds heard. Central nervous system: Alert and awake. Moves all extremities  Psychiatry: Judgement and insight appear abnormal. Flat mood and affect.     Data Reviewed: I have personally reviewed following labs and imaging studies  CBC: Recent Labs  Lab 02/19/21 0631  WBC 4.3  HGB 12.1  HCT 34.6*  MCV 89.2  PLT 774   Basic Metabolic Panel: Recent Labs  Lab 02/19/21 0631 02/20/21 0808  NA 136  --   K 3.5  --   CL 104  --   CO2 29  --   GLUCOSE 100*  --   BUN 16  --   CREATININE 0.74 0.66  CALCIUM 8.5*  --    GFR: Estimated Creatinine Clearance: 38.3 mL/min (by C-G formula based on SCr of 0.66 mg/dL). Liver Function Tests: No results for input(s): AST, ALT, ALKPHOS, BILITOT, PROT, ALBUMIN in the last 168 hours. No results for input(s): LIPASE, AMYLASE in the last 168 hours. No results for input(s): AMMONIA in the last 168 hours. Coagulation Profile: No results for input(s): INR, PROTIME in the last 168 hours. Cardiac Enzymes: No results for input(s): CKTOTAL, CKMB, CKMBINDEX,  TROPONINI in the last 168 hours. BNP (last 3 results) No results for input(s): PROBNP in the last 8760 hours. HbA1C: No results for input(s): HGBA1C in the last 72 hours. CBG: No results for input(s): GLUCAP in the last 168 hours. Lipid Profile: No results for input(s): CHOL, HDL, LDLCALC, TRIG, CHOLHDL, LDLDIRECT in the last 72 hours. Thyroid Function Tests: No results for input(s): TSH, T4TOTAL, FREET4, T3FREE, THYROIDAB in the last 72 hours. Anemia Panel: No results for input(s): VITAMINB12, FOLATE, FERRITIN, TIBC, IRON, RETICCTPCT in the last 72 hours. Sepsis Labs: No results for input(s): PROCALCITON, LATICACIDVEN in the last 168 hours.  Recent Results (from the past 240 hour(s))  Resp Panel by RT-PCR (Flu A&B, Covid)     Status: None   Collection  Time: 02/13/21  1:55 AM  Result Value Ref Range Status   SARS Coronavirus 2 by RT PCR NEGATIVE NEGATIVE Final    Comment: (NOTE) SARS-CoV-2 target nucleic acids are NOT DETECTED.  The SARS-CoV-2 RNA is generally detectable in upper respiratory specimens during the acute phase of infection. The lowest concentration of SARS-CoV-2 viral copies this assay can detect is 138 copies/mL. A negative result does not preclude SARS-Cov-2 infection and should not be used as the sole basis for treatment or other patient management decisions. A negative result may occur with  improper specimen collection/handling, submission of specimen other than nasopharyngeal swab, presence of viral mutation(s) within the areas targeted by this assay, and inadequate number of viral copies(<138 copies/mL). A negative result must be combined with clinical observations, patient history, and epidemiological information. The expected result is Negative.  Fact Sheet for Patients:  EntrepreneurPulse.com.au  Fact Sheet for Healthcare Providers:  IncredibleEmployment.be  This test is no t yet approved or cleared by the [place] FDA and  has been authorized for detection and/or diagnosis of SARS-CoV-2 by FDA under an Emergency Use Authorization (EUA). This EUA will remain  in effect (meaning this test can be used) for the duration of the COVID-19 declaration under Section 564(b)(1) of the Act, 21 U.S.C.section 360bbb-3(b)(1), unless the authorization is terminated  or revoked sooner.       Influenza A by PCR NEGATIVE NEGATIVE Final   Influenza B by PCR NEGATIVE NEGATIVE Final    Comment: (NOTE) The Xpert Xpress SARS-CoV-2/FLU/RSV plus assay is intended as an aid in the diagnosis of influenza from Nasopharyngeal swab specimens and should not be used as a sole basis for treatment. Nasal washings and aspirates are unacceptable for Xpert Xpress  SARS-CoV-2/FLU/RSV testing.  Fact Sheet for Patients: EntrepreneurPulse.com.au  Fact Sheet for Healthcare Providers: IncredibleEmployment.be  This test is not yet approved or cleared by the [place] FDA and has been authorized for detection and/or diagnosis of SARS-CoV-2 by FDA under an Emergency Use Authorization (EUA). This EUA will remain in effect (meaning this test can be used) for the duration of the COVID-19 declaration under Section 564(b)(1) of the Act, 21 U.S.C. section 360bbb-3(b)(1), unless the authorization is terminated or revoked.  Performed at [facility], [address] [place], [place] [address]   Resp Panel by RT-PCR (Flu A&B, Covid) Nasopharyngeal Swab     Status: None   Collection Time: 02/18/21  1:30 PM   Specimen: Nasopharyngeal Swab; Nasopharyngeal(NP) swabs in vial transport medium  Result Value Ref Range Status   SARS Coronavirus 2 by RT PCR NEGATIVE NEGATIVE Final    Comment: (NOTE) SARS-CoV-2 target nucleic acids are NOT DETECTED.  The SARS-CoV-2 RNA is generally detectable in upper  respiratory specimens during the acute phase of infection. The lowest concentration of SARS-CoV-2 viral copies this assay can detect is 138 copies/mL. A negative result does not preclude SARS-Cov-2 infection and should not be used as the sole basis for treatment or other patient management decisions. A negative result may occur with  improper specimen collection/handling, submission of specimen other than nasopharyngeal swab, presence of viral mutation(s) within the areas targeted by this assay, and inadequate number of viral copies(<138 copies/mL). A negative result must be combined with clinical observations, patient history, and epidemiological information. The expected result is Negative.  Fact Sheet for Patients:  EntrepreneurPulse.com.au  Fact Sheet for Healthcare Providers:   IncredibleEmployment.be  This test is no t yet approved or cleared by the [place] FDA and  has been authorized for detection and/or diagnosis of SARS-CoV-2 by FDA under an Emergency Use Authorization (EUA). This EUA will remain  in effect (meaning this test can be used) for the duration of the COVID-19 declaration under Section 564(b)(1) of the Act, 21 U.S.C.section 360bbb-3(b)(1), unless the authorization is terminated  or revoked sooner.       Influenza A by PCR NEGATIVE NEGATIVE Final   Influenza B by PCR NEGATIVE NEGATIVE Final    Comment: (NOTE) The Xpert Xpress SARS-CoV-2/FLU/RSV plus assay is intended as an aid in the diagnosis of influenza from Nasopharyngeal swab specimens and should not be used as a sole basis for treatment. Nasal washings and aspirates are unacceptable for Xpert Xpress SARS-CoV-2/FLU/RSV testing.  Fact Sheet for Patients: EntrepreneurPulse.com.au  Fact Sheet for Healthcare Providers: IncredibleEmployment.be  This test is not yet approved or cleared by the [place] FDA and has been authorized for detection and/or diagnosis of SARS-CoV-2 by FDA under an Emergency Use Authorization (EUA). This EUA will remain in effect (meaning this test can be used) for the duration of the COVID-19 declaration under Section 564(b)(1) of the Act, 21 U.S.C. section 360bbb-3(b)(1), unless the authorization is terminated or revoked.  Performed at [facility], [address] [place], [place] [address]          Radiology Studies: No results found.      Scheduled Meds:  aspirin EC  81 mg Oral Daily   atorvastatin  10 mg Oral QHS   clopidogrel  75 mg Oral Daily   enoxaparin (LOVENOX) injection  40 mg Subcutaneous Q24H   senna-docusate  1 tablet Oral BID   Continuous Infusions:   LOS: 7 days    Time spent: 15 mins    Wyvonnia Dusky, MD [facility] Pager [phone]  [phone]  If 7PM-7AM, please contact night-coverage  02/20/2021, 1:19 PM

## 2021-02-21 LAB — CBC
HCT: 35.1 % — ABNORMAL LOW (ref 36.0–46.0)
Hemoglobin: 12.1 g/dL (ref 12.0–15.0)
MCH: 30.6 pg (ref 26.0–34.0)
MCHC: 34.5 g/dL (ref 30.0–36.0)
MCV: 88.9 fL (ref 80.0–100.0)
Platelets: 161 10*3/uL (ref 150–400)
RBC: 3.95 MIL/uL (ref 3.87–5.11)
RDW: 13.1 % (ref 11.5–15.5)
WBC: 4.2 10*3/uL (ref 4.0–10.5)
nRBC: 0 % (ref 0.0–0.2)

## 2021-02-21 LAB — BASIC METABOLIC PANEL
Anion gap: 5 (ref 5–15)
BUN: 24 mg/dL — ABNORMAL HIGH (ref 8–23)
CO2: 26 mmol/L (ref 22–32)
Calcium: 8.7 mg/dL — ABNORMAL LOW (ref 8.9–10.3)
Chloride: 105 mmol/L (ref 98–111)
Creatinine, Ser: 0.74 mg/dL (ref 0.44–1.00)
GFR, Estimated: >60 mL/min (ref >60)
Glucose, Bld: 86 mg/dL (ref 70–99)
Potassium: 3.8 mmol/L (ref 3.5–5.1)
Sodium: 136 mmol/L (ref 135–145)

## 2021-02-21 LAB — RESP PANEL BY RT-PCR (FLU A&B, COVID) ARPGX2
Influenza A by PCR: NEGATIVE
Influenza B by PCR: NEGATIVE
SARS Coronavirus 2 by RT PCR: NEGATIVE

## 2021-02-21 NOTE — Discharge Summary (Signed)
D/c Summary as per Dr. Eliseo Squires:     Physician Discharge Summary  Melanie Simpson VQQ:[id number] DOB: 10/27/1923 DOA: 02/12/2021   PCP: Kirk Ruths, MD   Admit date: 02/12/2021 Discharge date: 02/21/2021   Admitted From: home Discharge disposition: SNF     Recommendations for Outpatient Follow-Up:    aspirin and Plavix for 3 weeks followed by aspirin alone Bowel regimen New med: statin-- follow LFTs, FLP     Discharge Diagnosis:    Principal Problem:   Acute CVA (cerebrovascular accident) [facility]) Active Problems:   Hypothyroid   Essential hypertension   Dementia without behavioral disturbance ([place])   Stroke [facility])       Discharge Condition: Improved.   Diet recommendation: Low sodium, heart healthy.   Wound care: None.   Code status: DNR     History of Present Illness:  87 year old female with HTN, dementia, comes into the hospital brought by her son due to couple of days of difficulty ambulating as well as left-sided facial droop.  Patient has been living with her son for the past 10 years, and he feels like this was a rather acute change but did have some progressive weakness for quite some time.  She was found to have a stroke and was admitted to the hospital.     Hospital Course by Problem:    Acute CVA- MRI on admission showed 1.5 cm acute ischemic right basal ganglia infarct.  Neurology consulted and followed patient while hospitalized.  She underwent a stroke work-up with carotid Doppler without hemodynamically significant stenosis, 2D echo showed EF 60-65%, normal RV.  Lipid panel showed an LDL of 199, she was placed on a statin.  A1c was 5.4.  Neurology recommends aspirin and Plavix for 3 weeks followed by aspirin alone.  PT recommending SNF, placement pending   Essential hypertension -blood pressure likely within her baseline currently  Dementia without behavioral disturbances-stable       Medical Consultants:    neuro     Discharge Exam:         Vitals:    02/19/21 0503 02/19/21 0750  BP: (!) 150/135 92/80  Pulse: (!) 57 62  Resp: 16 20  Temp: 97.6 F (36.4 C) (!) 97.5 F (36.4 C)  SpO2: 100% 100%          Vitals:    02/18/21 1615 02/18/21 2050 02/19/21 0503 02/19/21 0750  BP: (!) 157/66 (!) 142/108 (!) 150/135 92/80  Pulse: 74 70 (!) 57 62  Resp: 17 14 16 20   Temp: 97.7 F (36.5 C) 97.6 F (36.4 C) 97.6 F (36.4 C) (!) 97.5 F (36.4 C)  TempSrc: Oral Oral   Oral  SpO2: 99% 97% 100% 100%  Weight:          Height:              General exam: Appears calm and comfortable. .     The results of significant diagnostics from this hospitalization (including imaging, microbiology, ancillary and laboratory) are listed below for reference.      Procedures and Diagnostic Studies:    US Carotid Bilateral (at [facility] and [facility] only)   Result Date: 02/13/2021 CLINICAL DATA:  87 year old female with a history of stroke EXAM: BILATERAL CAROTID DUPLEX ULTRASOUND TECHNIQUE: Pearline Cables scale imaging, color Doppler and duplex ultrasound were performed of bilateral carotid and vertebral arteries in the neck. COMPARISON:  None. FINDINGS: Criteria: Quantification of carotid stenosis is based on velocity parameters that correlate the residual internal carotid  diameter with NASCET-based stenosis levels, using the diameter of the distal internal carotid lumen as the denominator for stenosis measurement. The following velocity measurements were obtained: RIGHT ICA:  Systolic 44 cm/sec, Diastolic 10 cm/sec CCA:  41 cm/sec SYSTOLIC ICA/CCA RATIO:  1.1 ECA:  71 cm/sec LEFT ICA:  Systolic 67 cm/sec, Diastolic 20 cm/sec CCA:  56 cm/sec SYSTOLIC ICA/CCA RATIO:  1.2 ECA:  47 cm/sec Right Brachial SBP: Not acquired Left Brachial SBP: Not acquired RIGHT CAROTID ARTERY: No significant calcifications of the right common carotid artery. Intermediate waveform maintained. Heterogeneous and partially calcified plaque at the right carotid bifurcation. No significant lumen  shadowing. Low resistance waveform of the right ICA. No significant tortuosity. RIGHT VERTEBRAL ARTERY: Antegrade flow with low resistance waveform. LEFT CAROTID ARTERY: No significant calcifications of the left common carotid artery. Intermediate waveform maintained. Heterogeneous and partially calcified plaque at the left carotid bifurcation without significant lumen shadowing. Low resistance waveform of the left ICA. No significant tortuosity. LEFT VERTEBRAL ARTERY:  Antegrade flow with low resistance waveform. IMPRESSION: Color duplex indicates minimal heterogeneous and calcified plaque, with no hemodynamically significant stenosis by duplex criteria in the extracranial cerebrovascular circulation. Signed, Dulcy Fanny. Dellia Nims, RPVI [facility] Electronically Signed   By: Corrie Mckusick D.O.   On: 02/13/2021 11:51   ECHOCARDIOGRAM COMPLETE   Result Date: 02/13/2021    ECHOCARDIOGRAM REPORT   Patient Name:   Melanie Simpson Date of Exam: 02/13/2021 Medical Rec #:  [id number]         Height:       63.0 in Accession #:    [id number]        Weight:       144.8 lb Date of Birth:  1924/01/12         BSA:          1.686 m Patient Age:    87 years          BP:           128/66 mmHg Patient Gender: F                 HR:           67 bpm. Exam Location:  [facility] Procedure: 2D Echo, Color Doppler and Cardiac Doppler Indications:     I63.9 Stroke  History:         Patient has no prior history of Echocardiogram examinations.                  Signs/Symptoms:Dementia; Risk Factors:Hypertension.  Sonographer:     Charmayne Sheer RDCS (AE) Referring Phys:  [id number] Athena Masse Diagnosing Phys: Serafina Royals MD  Sonographer Comments: Technically challenging study due to limited acoustic windows. Image acquisition challenging due to uncooperative patient. IMPRESSIONS  1. Left ventricular ejection fraction, by estimation, is 60 to 65%. The left ventricle has normal function. The  left ventricle has no regional wall motion abnormalities. Left ventricular diastolic parameters were normal.  2. Right ventricular systolic function is normal. The right ventricular size is normal.  3. The mitral valve is normal in structure. Moderate mitral valve regurgitation.  4. The aortic valve is normal in structure. Aortic valve regurgitation is trivial. FINDINGS  Left Ventricle: Left ventricular ejection fraction, by estimation, is 60 to 65%. The left ventricle has normal function. The left ventricle has no regional wall motion abnormalities. The left ventricular internal cavity size was normal in size. There is  no left  ventricular hypertrophy. Left ventricular diastolic parameters were normal. Right Ventricle: The right ventricular size is normal. No increase in right ventricular wall thickness. Right ventricular systolic function is normal. Left Atrium: Left atrial size was normal in size. Right Atrium: Right atrial size was normal in size. Pericardium: There is no evidence of pericardial effusion. Mitral Valve: The mitral valve is normal in structure. Moderate mitral valve regurgitation. MV peak gradient, 10.8 mmHg. The mean mitral valve gradient is 4.0 mmHg. Tricuspid Valve: The tricuspid valve is normal in structure. Tricuspid valve regurgitation is mild. Aortic Valve: The aortic valve is normal in structure. Aortic valve regurgitation is trivial. Aortic valve mean gradient measures 4.0 mmHg. Aortic valve peak gradient measures 7.0 mmHg. Aortic valve area, by VTI measures 2.68 cm. Pulmonic Valve: The pulmonic valve was normal in structure. Pulmonic valve regurgitation is not visualized. Aorta: The aortic root and ascending aorta are structurally normal, with no evidence of dilitation. IAS/Shunts: No atrial level shunt detected by color flow Doppler.  LEFT VENTRICLE PLAX 2D LVIDd:         3.40 cm  Diastology LVIDs:         2.30 cm  LV e' lateral:   5.98 cm/s LV PW:         1.20 cm  LV E/e' lateral: 8.5  LV IVS:        1.10 cm LVOT diam:     2.00 cm LV SV:         77 LV SV Index:   45 LVOT Area:     3.14 cm  LEFT ATRIUM           Index LA Vol (A4C): 46.5 ml 27.58 ml/m  AORTIC VALVE AV Area (Vmax):    2.78 cm AV Area (Vmean):   2.63 cm AV Area (VTI):     2.68 cm AV Vmax:           132.00 cm/s AV Vmean:          98.400 cm/s AV VTI:            0.286 m AV Peak Grad:      7.0 mmHg AV Mean Grad:      4.0 mmHg LVOT Vmax:         117.00 cm/s LVOT Vmean:        82.300 cm/s LVOT VTI:          0.244 m LVOT/AV VTI ratio: 0.85  AORTA Ao Root diam: 2.60 cm MITRAL VALVE MV Area (PHT): 5.97 cm     SHUNTS MV Area VTI:   2.23 cm     Systemic VTI:  0.24 m MV Peak grad:  10.8 mmHg    Systemic Diam: 2.00 cm MV Mean grad:  4.0 mmHg MV Vmax:       1.64 m/s MV Vmean:      94.3 cm/s MV Decel Time: 127 msec MV E velocity: 51.00 cm/s MV A velocity: 124.00 cm/s MV E/A ratio:  0.41 Serafina Royals MD Electronically signed by Serafina Royals MD Signature Date/Time: 02/13/2021/5:02:30 PM    Final         Labs:    Basic Metabolic Panel: Last Labs        Recent Labs  Lab 02/12/21 1722 02/13/21 0415 02/19/21 0631  NA 134*  -- 136  K 3.9  -- 3.5  CL 105  -- 104  CO2 23  -- 29  GLUCOSE 105*  -- 100*  BUN 14  -- 16  CREATININE 0.66 0.72 0.74  CALCIUM 8.9  -- 8.5*      GFR Estimated Creatinine Clearance: 38.3 mL/min (by C-G formula based on SCr of 0.74 mg/dL). Liver Function Tests: Last Labs   No results for input(s): AST, ALT, ALKPHOS, BILITOT, PROT, ALBUMIN in the last 168 hours.   Last Labs   No results for input(s): LIPASE, AMYLASE in the last 168 hours.   Last Labs   No results for input(s): AMMONIA in the last 168 hours.   Coagulation profile Last Labs   No results for input(s): INR, PROTIME in the last 168 hours.     CBC: Last Labs        Recent Labs  Lab 02/12/21 1722 02/13/21 0415 02/19/21 0631  WBC 3.8* 5.1 4.3  HGB 13.4 14.0 12.1  HCT 37.9 40.1 34.6*  MCV 87.1 88.7 89.2  PLT 160 163 155       Cardiac Enzymes: Last Labs   No results for input(s): CKTOTAL, CKMB, CKMBINDEX, TROPONINI in the last 168 hours.   BNP: Last Labs   Invalid input(s): POCBNP   CBG: Last Labs      Recent Labs  Lab 02/12/21 1716  GLUCAP 100*      D-Dimer Recent Labs (last 2 labs)   No results for input(s): DDIMER in the last 72 hours.   Hgb A1c Recent Labs (last 2 labs)   No results for input(s): HGBA1C in the last 72 hours.   Lipid Profile Recent Labs (last 2 labs)   No results for input(s): CHOL, HDL, LDLCALC, TRIG, CHOLHDL, LDLDIRECT in the last 72 hours.   Thyroid function studies  Recent Labs (last 2 labs)   No results for input(s): TSH, T4TOTAL, T3FREE, THYROIDAB in the last 72 hours.   Invalid input(s): FREET3   Anemia work up [employer] (last 2 labs)   No results for input(s): VITAMINB12, FOLATE, FERRITIN, TIBC, IRON, RETICCTPCT in the last 72 hours.   Microbiology        Recent Results (from the past 240 hour(s))  Resp Panel by RT-PCR (Flu A&B, Covid)     Status: None    Collection Time: 02/13/21  1:55 AM  Result Value Ref Range Status    SARS Coronavirus 2 by RT PCR NEGATIVE NEGATIVE Final      Comment: (NOTE) SARS-CoV-2 target nucleic acids are NOT DETECTED.   The SARS-CoV-2 RNA is generally detectable in upper respiratory specimens during the acute phase of infection. The lowest concentration of SARS-CoV-2 viral copies this assay can detect is 138 copies/mL. A negative result does not preclude SARS-Cov-2 infection and should not be used as the sole basis for treatment or other patient management decisions. A negative result may occur with improper specimen collection/handling, submission of specimen other than nasopharyngeal swab, presence of viral mutation(s) within the areas targeted by this assay, and inadequate number of viral copies(<138 copies/mL). A negative result must be combined with clinical observations, patient history, and  epidemiological information. The expected result is Negative.   Fact Sheet for Patients: EntrepreneurPulse.com.au   Fact Sheet for Healthcare Providers: IncredibleEmployment.be   This test is no t yet approved or cleared by the [place] FDA and has been authorized for detection and/or diagnosis of SARS-CoV-2 by FDA under an Emergency Use Authorization (EUA). This EUA will remain in effect (meaning this test can be used) for the duration of the COVID-19 declaration under Section 564(b)(1) of the Act, 21 U.S.C.section 360bbb-3(b)(1), unless the authorization is terminated or  revoked sooner.          Influenza A by PCR NEGATIVE NEGATIVE Final    Influenza B by PCR NEGATIVE NEGATIVE Final      Comment: (NOTE) The Xpert Xpress SARS-CoV-2/FLU/RSV plus assay is intended as an aid in the diagnosis of influenza from Nasopharyngeal swab specimens and should not be used as a sole basis for treatment. Nasal washings and aspirates are unacceptable for Xpert Xpress SARS-CoV-2/FLU/RSV testing.   Fact Sheet for Patients: EntrepreneurPulse.com.au   Fact Sheet for Healthcare Providers: IncredibleEmployment.be   This test is not yet approved or cleared by the [place] FDA and has been authorized for detection and/or diagnosis of SARS-CoV-2 by FDA under an Emergency Use Authorization (EUA). This EUA will remain in effect (meaning this test can be used) for the duration of the COVID-19 declaration under Section 564(b)(1) of the Act, 21 U.S.C. section 360bbb-3(b)(1), unless the authorization is terminated or revoked.   Performed at [facility], [address] [place], [place] [address]    Resp Panel by RT-PCR (Flu A&B, Covid) Nasopharyngeal Swab     Status: None    Collection Time: 02/18/21  1:30 PM    Specimen: Nasopharyngeal Swab; Nasopharyngeal(NP) swabs in vial transport medium  Result Value Ref  Range Status    SARS Coronavirus 2 by RT PCR NEGATIVE NEGATIVE Final      Comment: (NOTE) SARS-CoV-2 target nucleic acids are NOT DETECTED.   The SARS-CoV-2 RNA is generally detectable in upper respiratory specimens during the acute phase of infection. The lowest concentration of SARS-CoV-2 viral copies this assay can detect is 138 copies/mL. A negative result does not preclude SARS-Cov-2 infection and should not be used as the sole basis for treatment or other patient management decisions. A negative result may occur with improper specimen collection/handling, submission of specimen other than nasopharyngeal swab, presence of viral mutation(s) within the areas targeted by this assay, and inadequate number of viral copies(<138 copies/mL). A negative result must be combined with clinical observations, patient history, and epidemiological information. The expected result is Negative.   Fact Sheet for Patients: EntrepreneurPulse.com.au   Fact Sheet for Healthcare Providers: IncredibleEmployment.be   This test is no t yet approved or cleared by the [place] FDA and has been authorized for detection and/or diagnosis of SARS-CoV-2 by FDA under an Emergency Use Authorization (EUA). This EUA will remain in effect (meaning this test can be used) for the duration of the COVID-19 declaration under Section 564(b)(1) of the Act, 21 U.S.C.section 360bbb-3(b)(1), unless the authorization is terminated or revoked sooner.          Influenza A by PCR NEGATIVE NEGATIVE Final    Influenza B by PCR NEGATIVE NEGATIVE Final      Comment: (NOTE) The Xpert Xpress SARS-CoV-2/FLU/RSV plus assay is intended as an aid in the diagnosis of influenza from Nasopharyngeal swab specimens and should not be used as a sole basis for treatment. Nasal washings and aspirates are unacceptable for Xpert Xpress SARS-CoV-2/FLU/RSV testing.   Fact Sheet for  Patients: EntrepreneurPulse.com.au   Fact Sheet for Healthcare Providers: IncredibleEmployment.be   This test is not yet approved or cleared by the [place] FDA and has been authorized for detection and/or diagnosis of SARS-CoV-2 by FDA under an Emergency Use Authorization (EUA). This EUA will remain in effect (meaning this test can be used) for the duration of the COVID-19 declaration under Section 564(b)(1) of the Act, 21 U.S.C. section 360bbb-3(b)(1), unless the authorization is terminated or  revoked.   Performed at [facility], [address] [place], [place] [address]          Discharge Instructions:    Discharge Instructions       Ambulatory referral to Neurology   Complete by: As directed      An appointment is requested in approximately: 4 weeks    Diet - low sodium heart healthy   Complete by: As directed      Increase activity slowly   Complete by: As directed           Allergies as of 02/19/2021   No Known Allergies         Medication List       STOP taking these medications     meloxicam 7.5 MG tablet Commonly known as: MOBIC           TAKE these medications     aspirin 81 MG EC tablet Take 1 tablet (81 mg total) by mouth daily. Swallow whole.    atorvastatin 20 MG tablet Commonly known as: LIPITOR Take 0.5 tablets (10 mg total) by mouth at bedtime.    clopidogrel 75 MG tablet Commonly known as: PLAVIX Take 1 tablet (75 mg total) by mouth daily.    senna-docusate 8.6-50 MG tablet Commonly known as: Senokot-S Take 1 tablet by mouth 2 (two) times daily.             Follow-up Information       Kirk Ruths, MD Follow up in 1 week(s).   Specialty: Internal Medicine Contact information: [place] [address] [phone]                            Time coordinating discharge: 35 min   Signed:   Geradine Girt DO      [facility] 02/19/2021, 9:12 AM

## 2021-02-21 NOTE — Progress Notes (Signed)
Physical Therapy Treatment Patient Details Name: Melanie Simpson MRN: [id number] DOB: [birth year] Today's Date: 02/21/2021    History of Present Illness Pt is a 87 yo female that presented to the emergency department because of concerns for left facial droop and weakness. MRI showed "1.5 cm acute ischemic right basal ganglia infarct." PMH of dementia, GERD, HTN.    PT Comments    Pt in bed, inc of urine.  To EOB with min assist.  Steady in sitting. She is able to stand and transfer to [facility] with +1 hand held assist.  She voids on commode and urine seems a bit darker today.  She is assisted with bathing and lotion applied.  Time given to allow pt to participate in care as much as she can. She needs mod cues for upper body bathing to complete task.  Pericare provided.  She is able to stand to RW and take several small steps to recliner.  Upon turning she initiates sitting before completely turning and needs assist to guide her safely to chair.  Education provided as she has an increased risk for falls if behavior continues.  "I thought I was there."  Education on need to turn fully, keep hands on walker until ready to reach back for chair.  Will need continued re-enforcement.  She is given water and cranberry juice from breakfast tray and is encouraged to increase fluids.  She is drinking upon exiting room.  Discussed with tech.   Follow Up Recommendations  SNF     Equipment Recommendations  Other (comment)    Recommendations for Other Services       Precautions / Restrictions Precautions Precautions: Fall Restrictions Weight Bearing Restrictions: No    Mobility  Bed Mobility Overal bed mobility: Needs Assistance Bed Mobility: Supine to Sit     Supine to sit: Min assist          Transfers Overall transfer level: Needs assistance Equipment used: 1 person hand held assist;Rolling walker (2 wheeled) Transfers: Sit to/from Stand Sit to Stand: Min assist Stand pivot transfers:  Min assist          Ambulation/Gait Ambulation/Gait assistance: Min assist Gait Distance (Feet): 3 Feet Assistive device: Rolling walker (2 wheeled)   Gait velocity: decreased   General Gait Details: good steps but continues to sit before fully turned significanlty increasing risk for falls.   Stairs             Wheelchair Mobility    Modified Rankin (Stroke Patients Only)       Balance Overall balance assessment: Needs assistance Sitting-balance support: Feet supported Sitting balance-Leahy Scale: Good     Standing balance support: Bilateral upper extremity supported Standing balance-Leahy Scale: Poor Standing balance comment: posterior LOB frequently                            Cognition Arousal/Alertness: Awake/alert Behavior During Therapy: WFL for tasks assessed/performed Overall Cognitive Status: No family/caregiver present to determine baseline cognitive functioning                                        Exercises Other Exercises Other Exercises: to commode to void and for bathing.  encouraged to do on her own but needs mod cues for thouroughness    General Comments        Pertinent Vitals/Pain Pain Assessment: Faces  Faces Pain Scale: Hurts little more Pain Location: neck Pain Descriptors / Indicators: Sore Pain Intervention(s): Limited activity within patient's tolerance;Repositioned;Monitored during session    Home Living                      Prior Function            PT Goals (current goals can now be found in the care plan section) Progress towards PT goals: Progressing toward goals    Frequency    7X/week      PT Plan Current plan remains appropriate    Co-evaluation              AM-PAC PT "6 Clicks" Mobility   Outcome Measure  Help needed turning from your back to your side while in a flat bed without using bedrails?: A Little Help needed moving from lying on your back to  sitting on the side of a flat bed without using bedrails?: A Little Help needed moving to and from a bed to a chair (including a wheelchair)?: A Little Help needed standing up from a chair using your arms (e.g., wheelchair or bedside chair)?: A Little Help needed to walk in hospital room?: A Lot Help needed climbing 3-5 steps with a railing? : Total 6 Click Score: 15    End of Session Equipment Utilized During Treatment: Gait belt Activity Tolerance: Patient tolerated treatment well;Patient limited by fatigue Patient left: in chair;with call bell/phone within reach;with chair alarm set Nurse Communication: Mobility status;Other (comment) (bathing and darker urine- tech) PT Visit Diagnosis: Other abnormalities of gait and mobility (R26.89);Difficulty in walking, not elsewhere classified (R26.2);Muscle weakness (generalized) (M62.81);Unsteadiness on feet (R26.81)     Time: 1000-1028 PT Time Calculation (min) (ACUTE ONLY): 28 min  Charges:  $Gait Training: 8-22 mins $Therapeutic Activity: 8-22 mins                    Chesley Noon, PTA 02/21/21, 11:09 AM 02/21/2021, 11:05 AM

## 2021-02-21 NOTE — Progress Notes (Signed)
Attempted to call report times 2 attempts.  Transport has been scheduled and patient is going to [facility], room 708.  No answer after being on hold for at least 10 minutes.  Will try again later.

## 2021-02-21 NOTE — TOC Transition Note (Signed)
Transition of Care [facility]) - CM/SW Discharge Note   Patient Details  Name: Melanie Simpson MRN: [id number] Date of Birth: November 28, 1923  [facility] CM/SW Contact:  Shelbie Hutching, RN Phone Number: 02/21/2021, 2:13 PM   Clinical Narrative:     Patient is medically cleared for discharge to [facility] today.  Patient will be going to room 708.  Bedside RN will call report.  [facility] has a discharge at 3pm this afternoon and is requesting a 4pm pick up for patient so they have time to clean the room.  [facility] has been arranged for 4pm.  Patient's son [name] aware of discharge plan today.   Final next level of care: Skilled Nursing Facility Barriers to Discharge: Barriers Resolved   Patient Goals and CMS Choice Patient states their goals for this hospitalization and ongoing recovery are:: for pt to go to snf CMS Medicare.gov Compare Post Acute Care list provided to:: Patient Represenative (must comment) Choice offered to / list presented to : Adult Children  Discharge Placement              Patient chooses bed at: [facility] [place] Patient to be transferred to facility by: [facility] Name of family member notified: [name] Patient and family notified of of transfer: 02/21/21  Discharge Plan and Services In-house Referral: NA   Post Acute Care Choice: [place]          DME Arranged: N/A DME Agency: NA       HH Arranged: NA          Social Determinants of Health (SDOH) Interventions     Readmission Risk Interventions No flowsheet data found.

## 2021-04-08 ENCOUNTER — Encounter: Payer: Self-pay | Admitting: Diagnostic Neuroimaging

## 2021-04-08 ENCOUNTER — Ambulatory Visit: Payer: Medicare Other | Admitting: Diagnostic Neuroimaging

## 2021-07-19 ENCOUNTER — Encounter: Payer: Self-pay | Admitting: Emergency Medicine

## 2021-07-19 ENCOUNTER — Emergency Department: Payer: Medicare Other

## 2021-07-19 ENCOUNTER — Emergency Department
Admission: EM | Admit: 2021-07-19 | Discharge: 2021-07-19 | Disposition: A | Discharge status: Skilled Nursing Facility | Payer: Medicare Other | Attending: Emergency Medicine | Admitting: Emergency Medicine

## 2021-07-19 DIAGNOSIS — Z85828 Personal history of other malignant neoplasm of skin: Secondary | ICD-10-CM | POA: Diagnosis not present

## 2021-07-19 DIAGNOSIS — W19XXXA Unspecified fall, initial encounter: Secondary | ICD-10-CM

## 2021-07-19 DIAGNOSIS — R300 Dysuria: Secondary | ICD-10-CM

## 2021-07-19 DIAGNOSIS — Z7982 Long term (current) use of aspirin: Secondary | ICD-10-CM | POA: Insufficient documentation

## 2021-07-19 DIAGNOSIS — M25552 Pain in left hip: Secondary | ICD-10-CM | POA: Insufficient documentation

## 2021-07-19 DIAGNOSIS — F0390 Unspecified dementia without behavioral disturbance: Secondary | ICD-10-CM | POA: Insufficient documentation

## 2021-07-19 DIAGNOSIS — E039 Hypothyroidism, unspecified: Secondary | ICD-10-CM | POA: Insufficient documentation

## 2021-07-19 DIAGNOSIS — Z7902 Long term (current) use of antithrombotics/antiplatelets: Secondary | ICD-10-CM | POA: Diagnosis not present

## 2021-07-19 DIAGNOSIS — J45909 Unspecified asthma, uncomplicated: Secondary | ICD-10-CM | POA: Insufficient documentation

## 2021-07-19 DIAGNOSIS — I10 Essential (primary) hypertension: Secondary | ICD-10-CM | POA: Insufficient documentation

## 2021-07-19 DIAGNOSIS — M549 Dorsalgia, unspecified: Secondary | ICD-10-CM | POA: Insufficient documentation

## 2021-07-19 DIAGNOSIS — N3001 Acute cystitis with hematuria: Secondary | ICD-10-CM

## 2021-07-19 LAB — URINALYSIS, COMPLETE (UACMP) WITH MICROSCOPIC
Bilirubin Urine: NEGATIVE
Glucose, UA: NEGATIVE mg/dL
Ketones, ur: 5 mg/dL — AB
Nitrite: POSITIVE — AB
Protein, ur: 100 mg/dL — AB
Specific Gravity, Urine: 1.02 (ref 1.005–1.030)
WBC, UA: >50 WBC/hpf — ABNORMAL HIGH (ref 0–5)
pH: 7 (ref 5.0–8.0)

## 2021-07-19 MED ORDER — CEPHALEXIN 500 MG PO CAPS: 500.0000 mg | ORAL_CAPSULE | Freq: Three times a day (TID) | ORAL | 0 refills | Status: AC

## 2021-07-19 MED ORDER — CEPHALEXIN 500 MG PO CAPS
500.0000 mg | ORAL_CAPSULE | Freq: Once | ORAL | Status: AC
  Administered 2021-07-19: 500 mg via ORAL
  Filled 2021-07-19: qty 1

## 2021-07-19 MED ORDER — OXYCODONE HCL 5 MG PO TABS
5.0000 mg | ORAL_TABLET | Freq: Once | ORAL | Status: AC
  Administered 2021-07-19: 5 mg via ORAL
  Filled 2021-07-19: qty 1

## 2021-07-19 NOTE — ED Provider Notes (Signed)
Emergency Medicine Provider Triage Evaluation Note  Melanie Simpson , a 87 y.o. female  was evaluated in triage.  Pt complains of lower back and left hip pain.  Patient has a history of dementia, reportedly had a fall.  Patient does not remember falling.  Her only complaint is lower back and left hip pain.  She denied headache, neck pain, musculoskeletal pain, chest pain, abdominal pain.  Review of Systems  Positive: Fall, low back and left hip pain Negative: Headache, visual changes, neck pain, chest pain, abdominal pain, shortness of breath, upper or lower extremity pain  Physical Exam  BP (!) 153/76   Pulse 84   Temp 98 F (36.7 C) (Oral)   Resp 18   Ht 5\' 3"  (1.6 m)   Wt 65 kg   SpO2 96%   BMI 25.38 kg/m  Gen:   Awake, no distress   Resp:  Normal effort  MSK:   Moves extremities without difficulty.  No visible signs of trauma to the lumbar spine or left hip Other:    Medical Decision Making  Medically screening exam initiated at 11:58 AM.  Appropriate orders placed.  Tera Pellicane was informed that the remainder of the evaluation will be completed by another provider, this initial triage assessment does not replace that evaluation, and the importance of remaining in the ED until their evaluation is complete.  Patient here after reported fall.  Only complaint is low back and left hip pain.  Patient has dementia and cannot answer other questions at this time.  She denied other complaints.  We will start with imaging of the head, neck, low back and left hip.   Brynda Peon 07/19/21 1158    Vladimir Crofts, MD 07/19/21 [phone]

## 2021-07-19 NOTE — ED Triage Notes (Signed)
Pt arrives from [facility] after a fall. Pt does not recall falling. Just states she is hurting in her back. Hx of dementia. Pt is on plavix.

## 2021-07-19 NOTE — Discharge Instructions (Signed)
No significant injuries from her fall, but she does have a UTI. Take all 15 tablets of Keflex to treat UTI. Return to the ED with any further falls or fevers/worsening symptoms.

## 2021-07-19 NOTE — ED Triage Notes (Signed)
Pt in via EMS from [facility] with c/o fall. EMS reports fall unwitnessed and fell around 0340 this am and c/o left sided back pain. 97.7, temp, FSBS 170, HR 80, 171/72, 99% RA. EMS reports pt with odor to urine as well.

## 2021-07-19 NOTE — ED Notes (Signed)
This RN called [place] gave report to Star, Therapist, sports.

## 2021-07-19 NOTE — ED Notes (Signed)
Medical Necessity form completed.

## 2021-07-19 NOTE — ED Notes (Signed)
Called [facility] for transport to [place]

## 2021-07-19 NOTE — ED Provider Notes (Signed)
[facility] Emergency Department Provider Note ____________________________________________   Event Date/Time   First MD Initiated Contact with Patient 07/19/21 1442     (approximate)  I have reviewed the triage vital signs and the nursing notes.  HISTORY  Chief Complaint Fall and Back Pain   HPI Melanie Simpson is a 87 y.o. femalewho presents to the ED for evaluation of fall and back pain.   Chart review indicates history of HTN, dementia and stroke about 6 months ago.  Patient presents to the ED from her local SNF for evaluation of a fall and associated left hip pain..  She cannot provide much history as she does not recall falling.  She is reporting left-sided hip pain. Also tells me that her "urethra hurts."  History otherwise limited.  Past Medical History:  Diagnosis Date   Allergy    Anemia    Arthritis    Cancer ([place])    SKIN CANCER   Dementia ([place])    GERD (gastroesophageal reflux disease)    Hypertension    H/O WAS ON LISINOPRIL-CURRENTLY ON NO MEDS    Patient Active Problem List   Diagnosis Date Noted   Acute CVA (cerebrovascular accident) ([place]) 02/13/2021   Dementia without behavioral disturbance ([place]) 02/13/2021   Stroke ([place]) 02/13/2021   Venous insufficiency 02/28/2020   Thrombocytopenia ([place]) 06/24/2017   GERD (gastroesophageal reflux disease) 03/11/2017   Senile osteoporosis 02/28/2015   Hypothyroid 02/28/2015   Hyperlipemia 02/28/2015   Stricture and stenosis of esophagus 02/28/2015   Nocturnal and diurnal enuresis 02/28/2015   Seasonal allergic rhinitis 02/28/2015   Allergic asthma 02/28/2015   Arthritis 02/28/2015   Varicella 02/28/2015   Essential hypertension 02/28/2015   Gravida 3 para 3 02/28/2015   Absence of bladder continence 02/28/2015   Allergic rhinitis, seasonal 02/28/2015    Past Surgical History:  Procedure Laterality Date   MASS EXCISION Right 04/05/2020   Procedure: EXCISION MASS;  Surgeon:  Robert Bellow, MD;  Location: [facility] ORS;  Service: General;  Laterality: Right;  right leg   NO PAST SURGERIES      Prior to Admission medications   Medication Sig Start Date End Date Taking? Authorizing Provider  cephALEXin (KEFLEX) 500 MG capsule Take 1 capsule (500 mg total) by mouth 3 (three) times daily for 5 days. 07/19/21 07/24/21 Yes Vladimir Crofts, MD  aspirin EC 81 MG EC tablet Take 1 tablet (81 mg total) by mouth daily. Swallow whole. 02/19/21   Geradine Girt, DO  atorvastatin (LIPITOR) 20 MG tablet Take 0.5 tablets (10 mg total) by mouth at bedtime. 02/19/21   Geradine Girt, DO  clopidogrel (PLAVIX) 75 MG tablet Take 1 tablet (75 mg total) by mouth daily. 02/19/21   Geradine Girt, DO  senna-docusate (SENOKOT-S) 8.6-50 MG tablet Take 1 tablet by mouth 2 (two) times daily. 02/19/21   Geradine Girt, DO    Allergies Patient has no known allergies.  Family History  Problem Relation Age of Onset   Heart disease Father    Cancer Other        breast   Diabetes Sister    Diabetes Son     Social History Social History   Tobacco Use   Smoking status: Never   Smokeless tobacco: Never  Vaping Use   Vaping Use: Never used  Substance Use Topics   Alcohol use: Not Currently    Alcohol/week: 0.0 standard drinks   Drug use: No    Review of Systems  Unable to accurately assess due to patient's disorientation and dementia ____________________________________________   PHYSICAL EXAM:  VITAL SIGNS: Vitals:   07/19/21 1139  BP: (!) 153/76  Pulse: 84  Resp: 18  Temp: 98 F (36.7 C)  SpO2: 96%     Constitutional: Alert and pleasantly disoriented.  in no acute distress.  Follows commands in all 4 extremities. Eyes: Conjunctivae are normal. PERRL. EOMI. Head: Atraumatic. Nose: No congestion/rhinnorhea. Mouth/Throat: Mucous membranes are moist.  Oropharynx non-erythematous. Neck: No stridor. No cervical spine tenderness to palpation. Cardiovascular: Normal rate,  regular rhythm. Grossly normal heart sounds.  Good peripheral circulation. Respiratory: Normal respiratory effort.  No retractions. Lungs CTAB. Gastrointestinal: Soft , nondistended,. No CVA tenderness. Suprapubic tenderness without peritoneal features. Musculoskeletal: No lower extremity tenderness nor edema.  No joint effusions. No signs of acute trauma. No pelvic instability.  No deformity to the 4 extremities. Neurologic:  No gross focal neurologic deficits are appreciated.  Follows commands in all 4 Skin:  Skin is warm, dry and intact. No rash noted. Psychiatric: Mood and affect are normal. Speech and behavior are normal. ____________________________________________   LABS (all labs ordered are listed, but only abnormal results are displayed)  Labs Reviewed  URINALYSIS, COMPLETE (UACMP) WITH MICROSCOPIC - Abnormal; Notable for the following components:      Result Value   Color, Urine YELLOW (*)    APPearance TURBID (*)    Hgb urine dipstick SMALL (*)    Ketones, ur 5 (*)    Protein, ur 100 (*)    Nitrite POSITIVE (*)    Leukocytes,Ua MODERATE (*)    WBC, UA >50 (*)    Bacteria, UA MANY (*)    Non Squamous Epithelial PRESENT (*)    All other components within normal limits  URINE CULTURE   ____________________________________________  12 Lead EKG   ____________________________________________  RADIOLOGY  ED MD interpretation: Plain films of the left hip, pelvis and lumbar spine reviewed to me without evidence of acute fracture. CT head reviewed by me without evidence of acute intracranial pathology.  Official radiology report(s): DG Lumbar Spine 2-3 Views  Result Date: 07/19/2021 CLINICAL DATA:  Back pain after fall. EXAM: LUMBAR SPINE - 2-3 VIEW COMPARISON:  Lumbar spine x-ray report dated April 24, 2020. FINDINGS: Five lumbar type vertebral bodies. No acute fracture or subluxation. Chronic mild L1 compression deformity status post cement augmentation. Remaining  vertebral body heights are preserved. Anterolisthesis at L4-L5 and L5-S1. Severe disc height loss at L5-S1. Moderate disc height loss at L3-L4 and L4-L5. The sacroiliac joints are unremarkable. IMPRESSION: 1. No acute osseous abnormality. 2. Chronic mild L1 compression deformity status post cement augmentation. 3. Multilevel degenerative disc disease, severe at L5-S1. Electronically Signed   By: Titus Dubin M.D.   On: 07/19/2021 12:58   CT HEAD WO CONTRAST (5MM)  Result Date: 07/19/2021 CLINICAL DATA:  Trauma EXAM: CT HEAD WITHOUT CONTRAST CT CERVICAL SPINE WITHOUT CONTRAST TECHNIQUE: Multidetector CT imaging of the head and cervical spine was performed following the standard protocol without intravenous contrast. Multiplanar CT image reconstructions of the cervical spine were also generated. COMPARISON:  None. FINDINGS: CT HEAD FINDINGS Brain: Chronic white matter ischemic change. Ventricles are unchanged in size. No evidence of acute infarction, hemorrhage, extra-axial collection or mass lesion/mass effect. Vascular: No hyperdense vessel or unexpected calcification. Skull: Normal. Negative for fracture or focal lesion. Sinuses/Orbits: Mucocele of the right frontal sinus. No acute abnormality. Other: None. CT CERVICAL SPINE FINDINGS Alignment: Normal. Skull base and vertebrae: No  acute fracture. No primary bone lesion or focal pathologic process. Soft tissues and spinal canal: No prevertebral fluid or swelling. No visible canal hematoma. Disc levels:  Multilevel mild-to-moderate degenerative disc disease. Upper chest: Nodular opacity of the right lung apex measuring 1.6 x 1.0 cm. Other: None IMPRESSION: 1. No acute intracranial abnormality. 2. No CT evidence of acute cervical spine injury. 3. Partially imaged nodular opacity of the right lung apex measuring up to 1.6 cm, possibly an area of focal scarring, but incompletely evaluated due to limited field of view. Recommend nonemergent dedicated chest CT.  Electronically Signed   By: Yetta Glassman M.D.   On: 07/19/2021 14:51   CT Cervical Spine Wo Contrast  Result Date: 07/19/2021 CLINICAL DATA:  Trauma EXAM: CT HEAD WITHOUT CONTRAST CT CERVICAL SPINE WITHOUT CONTRAST TECHNIQUE: Multidetector CT imaging of the head and cervical spine was performed following the standard protocol without intravenous contrast. Multiplanar CT image reconstructions of the cervical spine were also generated. COMPARISON:  None. FINDINGS: CT HEAD FINDINGS Brain: Chronic white matter ischemic change. Ventricles are unchanged in size. No evidence of acute infarction, hemorrhage, extra-axial collection or mass lesion/mass effect. Vascular: No hyperdense vessel or unexpected calcification. Skull: Normal. Negative for fracture or focal lesion. Sinuses/Orbits: Mucocele of the right frontal sinus. No acute abnormality. Other: None. CT CERVICAL SPINE FINDINGS Alignment: Normal. Skull base and vertebrae: No acute fracture. No primary bone lesion or focal pathologic process. Soft tissues and spinal canal: No prevertebral fluid or swelling. No visible canal hematoma. Disc levels:  Multilevel mild-to-moderate degenerative disc disease. Upper chest: Nodular opacity of the right lung apex measuring 1.6 x 1.0 cm. Other: None IMPRESSION: 1. No acute intracranial abnormality. 2. No CT evidence of acute cervical spine injury. 3. Partially imaged nodular opacity of the right lung apex measuring up to 1.6 cm, possibly an area of focal scarring, but incompletely evaluated due to limited field of view. Recommend nonemergent dedicated chest CT. Electronically Signed   By: Yetta Glassman M.D.   On: 07/19/2021 14:51   DG Hip Unilat W or Wo Pelvis 2-3 Views Left  Result Date: 07/19/2021 CLINICAL DATA:  Left hip pain after fall. EXAM: DG HIP (WITH OR WITHOUT PELVIS) 2-3V LEFT COMPARISON:  None. FINDINGS: No acute fracture or dislocation. Mild axial right hip joint space narrowing. The left hip joint  space is relatively preserved. The pubic symphysis and sacroiliac joints are intact. Soft tissues are unremarkable. IMPRESSION: 1. No acute osseous abnormality. Electronically Signed   By: Titus Dubin M.D.   On: 07/19/2021 13:00    ____________________________________________   PROCEDURES and INTERVENTIONS  Procedure(s) performed (including Critical Care):  Procedures  Medications  oxyCODONE (Oxy IR/ROXICODONE) immediate release tablet 5 mg (has no administration in time range)  cephALEXin (KEFLEX) capsule 500 mg (has no administration in time range)    ____________________________________________   MDM / ED COURSE   Pleasantly demented 87 year old woman presents to the ED after a fall, without evidence of significant traumatic pathology, but noted to have acute cystitis requiring initiating course of antibiotics.  No evidence of systemic illness, sepsis, neurologic or vascular deficits.  No signs of significant deformity on examination from her fall.  X-rays without evidence of fracture to the lumbar spine, pelvis or left hip.  CT head and neck unremarkable.  Due to her complaints of urethral pain, urinalysis obtained and demonstrates evidence of acute cystitis.  We will get her started on antibiotics and return her to her SNF.  ____________________________________________   FINAL CLINICAL IMPRESSION(S) / ED DIAGNOSES  Final diagnoses:  Fall, initial encounter  Dysuria  Acute cystitis with hematuria     ED Discharge Orders          Ordered    cephALEXin (KEFLEX) 500 MG capsule  3 times daily        07/19/21 1558             Sakara Lehtinen   Note:  This document was prepared using Systems analyst and may include unintentional dictation errors.    Vladimir Crofts, MD 07/19/21 [phone]

## 2021-07-22 LAB — URINE CULTURE: Culture: >=100000 — AB

## 2021-08-18 ENCOUNTER — Encounter (HOSPITAL_COMMUNITY): Payer: Self-pay | Admitting: Radiology

## 2022-05-10 IMAGING — MR MR HEAD W/O CM
6 series · 48 of 48 positions shown · non-contrast
Comparison: Prior CT from earlier the same day.

CLINICAL DATA: Initial evaluation for acute left facial weakness
[place].

EXAM:
MRI HEAD WITHOUT CONTRAST
TECHNIQUE: Multiplanar, multiecho pulse sequences of the brain and surrounding
structures were obtained without intravenous contrast.

[Series 3: ax dwi_tracew · axial · 3.0mm · 1.31mm/px · z∈[-158,-2]mm · 11 of 48 slices shown]
[im 1/48]
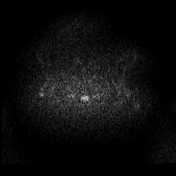
[im 5/48]
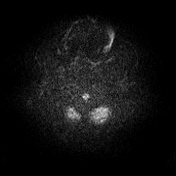
[im 10/48]
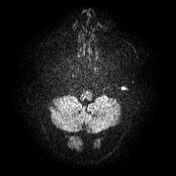
[im 15/48]
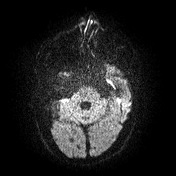
[im 19/48]
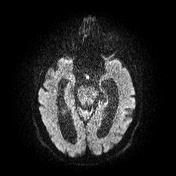
[im 24/48]
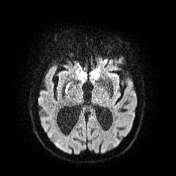
[im 29/48]
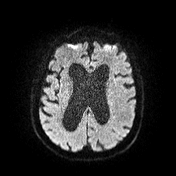
[im 33/48]
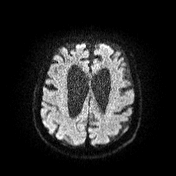
[im 38/48]
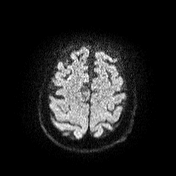
[im 43/48]
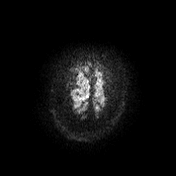
[im 48/48]
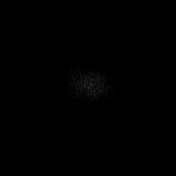

[Series 4: ax dwi_adc · axial · 3.0mm · 1.31mm/px · z∈[-158,-2]mm · 10 of 48 slices shown]
[im 1/48]
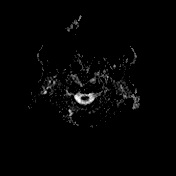
[im 6/48]
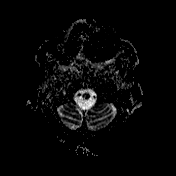
[im 11/48]
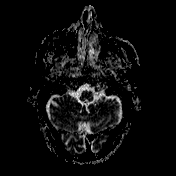
[im 16/48]
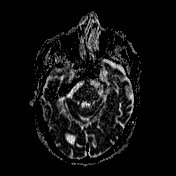
[im 21/48]
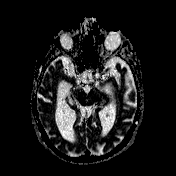
[im 27/48]
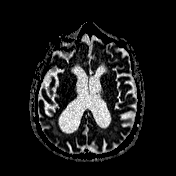
[im 32/48]
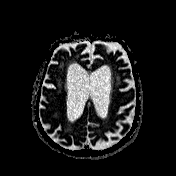
[im 37/48]
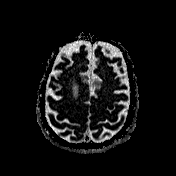
[im 42/48]
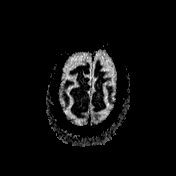
[im 48/48]
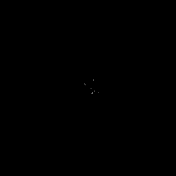

[Series 5: cor dwi_tracew · coronal · 5.0mm · 1.31mm/px · 8 of 38 slices shown]
[im 1/38]
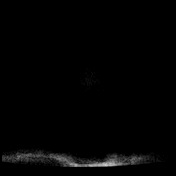
[im 6/38]
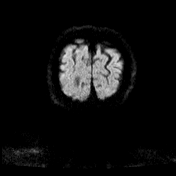
[im 11/38]
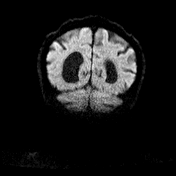
[im 16/38]
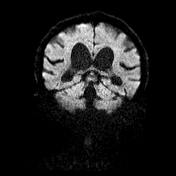
[im 22/38]
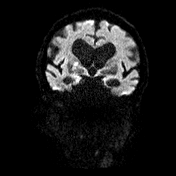
[im 27/38]
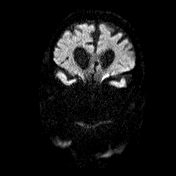
[im 32/38]
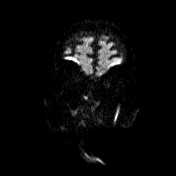
[im 38/38]
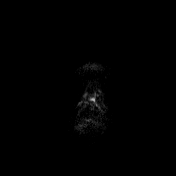

[Series 6: cor dwi_adc · coronal · 5.0mm · 1.31mm/px · 8 of 38 slices shown]
[im 1/38]
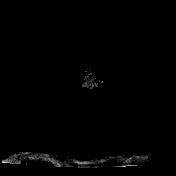
[im 6/38]
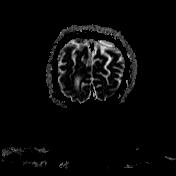
[im 11/38]
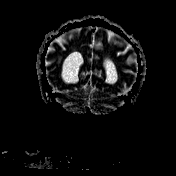
[im 16/38]
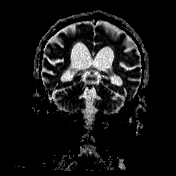
[im 22/38]
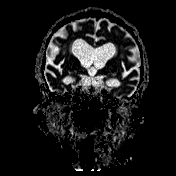
[im 27/38]
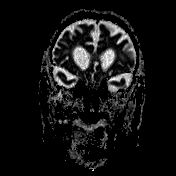
[im 32/38]
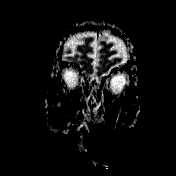
[im 38/38]
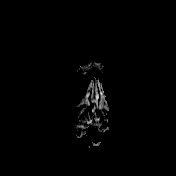

[Series 7: T1 · sagittal · 5.0mm · 0.94mm/px · 5 of 23 slices shown]
[im 1/23]
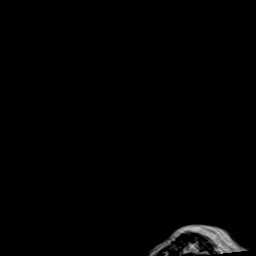
[im 6/23]
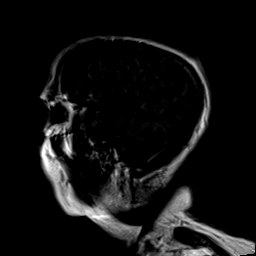
[im 12/23]
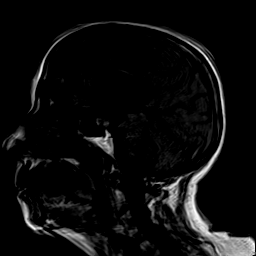
[im 17/23]
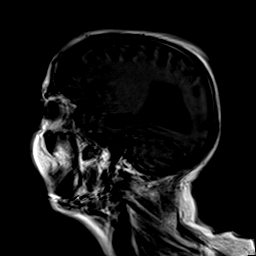
[im 23/23]
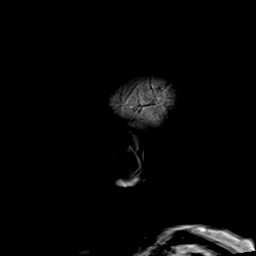

[Series 8: T2 · axial · 5.0mm · 0.45mm/px · z∈[-158,-2]mm · 6 of 27 slices shown]
[im 1/27]
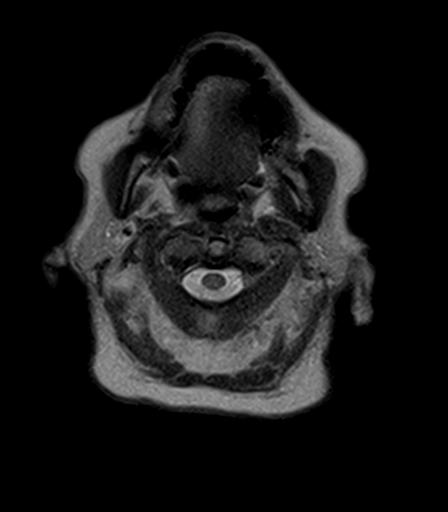
[im 6/27]
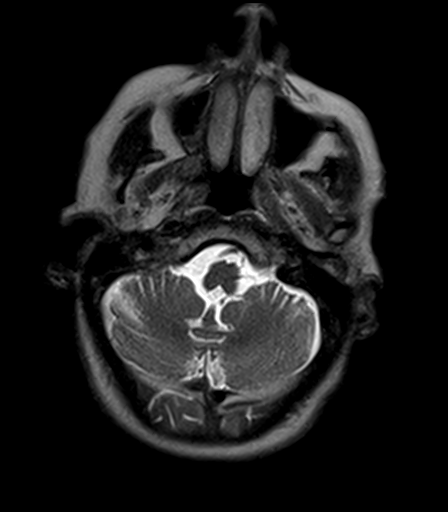
[im 11/27]
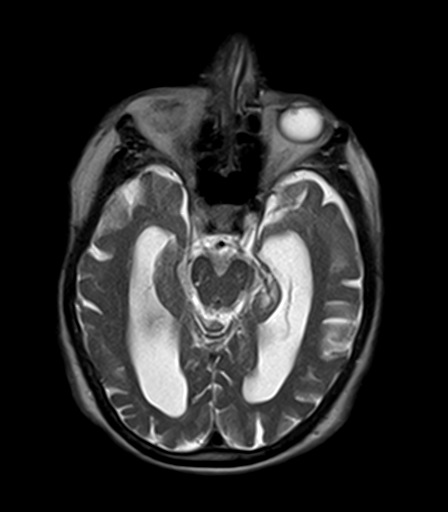
[im 16/27]
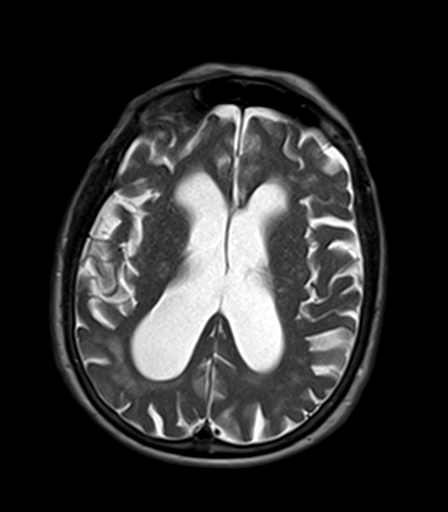
[im 21/27]
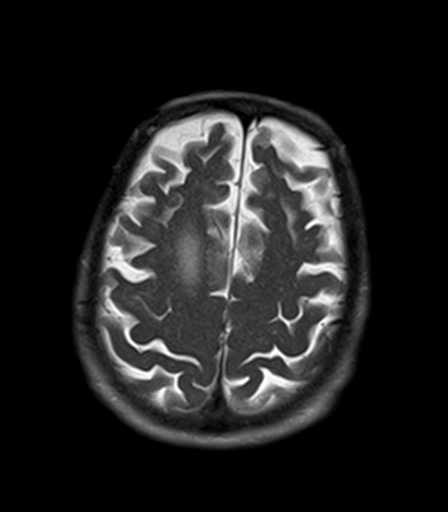
[im 27/27]
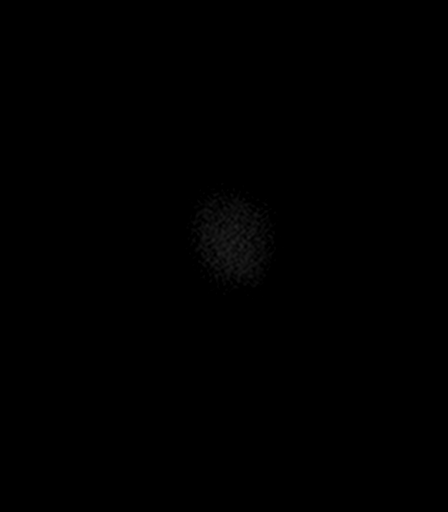

[48 of 48 positions shown; findings below may reference images not displayed]

FINDINGS: Brain: Examination technically limited as the patient was unable to
tolerate the full length of the study. Additionally, provided images
are somewhat degraded by motion artifact.

Moderately advanced cerebral atrophy, most pronounced at the
temporal lobes bilaterally, worse on the left. Patchy T2 signal
abnormality within the periventricular and deep white matter most
consistent with chronic small vessel ischemic disease, moderate in
nature.

Approximate 1.5 cm curvilinear focus of restricted diffusion seen
involving the right basal ganglia, consistent with an acute ischemic
infarct (series 3, image 27). No obvious associated hemorrhage,
although evaluation somewhat limited as no SWI sequence is
available. No visible hemorrhage on prior CT. No mass effect.

No other evidence for acute or subacute ischemia. Gray-white matter
differentiation otherwise maintained. No encephalomalacia to suggest
chronic cortical infarction.

No mass lesion, midline shift or mass effect. Diffuse ventricular
prominence related to global parenchymal volume loss without
hydrocephalus. No extra-axial fluid collection.

Pituitary gland suprasellar region grossly normal. Midline
structures intact.

Vascular: Major intracranial vascular flow voids are grossly
maintained at the skull base.

Skull and upper cervical spine: Craniocervical junction within
normal limits. Bone marrow signal intensity normal. No scalp soft
tissue abnormality.

Sinuses/Orbits: Patient status post bilateral ocular lens
replacement. Globes and orbital soft tissues demonstrate no acute
finding. Paranasal sinuses are grossly clear. No significant mastoid
effusion. Inner ear structures grossly normal.

Other: None.
IMPRESSION: 1. 1.5 cm acute ischemic right basal ganglia infarct.
2. Moderately advanced temporal lobe predominant cerebral atrophy
with chronic small vessel ischemic disease.
